# Patient Record
Sex: Female | Born: 1935 | Race: White | Hispanic: No | Smoking: Former smoker
Health system: Southern US, Community
[De-identification: ages and names within clinical notes are randomized; demographics above are authoritative.]

## PROBLEM LIST (undated history)

## (undated) DIAGNOSIS — Z9889 Other specified postprocedural states: Secondary | ICD-10-CM

## (undated) DIAGNOSIS — C439 Malignant melanoma of skin, unspecified: Secondary | ICD-10-CM

## (undated) DIAGNOSIS — I341 Nonrheumatic mitral (valve) prolapse: Secondary | ICD-10-CM

## (undated) DIAGNOSIS — E039 Hypothyroidism, unspecified: Secondary | ICD-10-CM

## (undated) DIAGNOSIS — R55 Syncope and collapse: Secondary | ICD-10-CM

## (undated) DIAGNOSIS — M8580 Other specified disorders of bone density and structure, unspecified site: Secondary | ICD-10-CM

## (undated) DIAGNOSIS — R112 Nausea with vomiting, unspecified: Secondary | ICD-10-CM

## (undated) DIAGNOSIS — M1990 Unspecified osteoarthritis, unspecified site: Secondary | ICD-10-CM

## (undated) DIAGNOSIS — N189 Chronic kidney disease, unspecified: Secondary | ICD-10-CM

## (undated) DIAGNOSIS — K8020 Calculus of gallbladder without cholecystitis without obstruction: Secondary | ICD-10-CM

## (undated) DIAGNOSIS — D649 Anemia, unspecified: Secondary | ICD-10-CM

## (undated) DIAGNOSIS — I10 Essential (primary) hypertension: Secondary | ICD-10-CM

## (undated) DIAGNOSIS — E785 Hyperlipidemia, unspecified: Secondary | ICD-10-CM

## (undated) HISTORY — DX: Syncope and collapse: R55

## (undated) HISTORY — PX: ORIF ELBOW FRACTURE: SHX5031

## (undated) HISTORY — DX: Malignant melanoma of skin, unspecified: C43.9

## (undated) HISTORY — DX: Nonrheumatic mitral (valve) prolapse: I34.1

## (undated) HISTORY — PX: EXCISION MORTON'S NEUROMA: SHX5013

## (undated) HISTORY — DX: Other specified disorders of bone density and structure, unspecified site: M85.80

## (undated) HISTORY — PX: CATARACT EXTRACTION, BILATERAL: SHX1313

## (undated) HISTORY — PX: ULNAR NERVE TRANSPOSITION: SHX2595

## (undated) HISTORY — DX: Hyperlipidemia, unspecified: E78.5

## (undated) HISTORY — PX: BUNIONECTOMY: SHX129

## (undated) HISTORY — DX: Anemia, unspecified: D64.9

## (undated) HISTORY — DX: Hypothyroidism, unspecified: E03.9

---
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
## 1965-09-24 HISTORY — PX: APPENDECTOMY: SHX54

## 1999-12-26 ENCOUNTER — Other Ambulatory Visit: Admission: RE | Admit: 1999-12-26 | Discharge: 1999-12-26 | Payer: Self-pay | Admitting: Oncology

## 1999-12-26 ENCOUNTER — Encounter (HOSPITAL_COMMUNITY): Payer: Self-pay | Admitting: Oncology

## 1999-12-26 ENCOUNTER — Encounter: Admission: RE | Admit: 1999-12-26 | Discharge: 1999-12-26 | Payer: Self-pay | Admitting: Oncology

## 2001-03-03 ENCOUNTER — Encounter (HOSPITAL_COMMUNITY): Admission: RE | Admit: 2001-03-03 | Discharge: 2001-04-02 | Payer: Self-pay | Admitting: Oncology

## 2001-03-03 ENCOUNTER — Encounter: Admission: RE | Admit: 2001-03-03 | Discharge: 2001-03-03 | Payer: Self-pay | Admitting: Oncology

## 2001-03-03 ENCOUNTER — Encounter (HOSPITAL_COMMUNITY): Payer: Self-pay | Admitting: Oncology

## 2001-05-06 ENCOUNTER — Encounter (HOSPITAL_COMMUNITY): Admission: RE | Admit: 2001-05-06 | Discharge: 2001-06-05 | Payer: Self-pay | Admitting: Oncology

## 2001-05-06 ENCOUNTER — Encounter: Admission: RE | Admit: 2001-05-06 | Discharge: 2001-05-06 | Payer: Self-pay | Admitting: Oncology

## 2001-05-30 ENCOUNTER — Ambulatory Visit (HOSPITAL_COMMUNITY): Admission: RE | Admit: 2001-05-30 | Discharge: 2001-05-30 | Payer: Self-pay | Admitting: Internal Medicine

## 2001-06-26 ENCOUNTER — Encounter: Admission: RE | Admit: 2001-06-26 | Discharge: 2001-06-26 | Payer: Self-pay | Admitting: Oncology

## 2001-06-26 ENCOUNTER — Encounter (HOSPITAL_COMMUNITY): Admission: RE | Admit: 2001-06-26 | Discharge: 2001-07-26 | Payer: Self-pay | Admitting: Oncology

## 2002-02-13 ENCOUNTER — Encounter: Payer: Self-pay | Admitting: Otolaryngology

## 2002-02-13 ENCOUNTER — Ambulatory Visit (HOSPITAL_COMMUNITY): Admission: RE | Admit: 2002-02-13 | Discharge: 2002-02-13 | Payer: Self-pay | Admitting: Otolaryngology

## 2002-03-09 ENCOUNTER — Encounter: Admission: RE | Admit: 2002-03-09 | Discharge: 2002-03-09 | Payer: Self-pay | Admitting: Oncology

## 2002-03-09 ENCOUNTER — Encounter (HOSPITAL_COMMUNITY): Payer: Self-pay | Admitting: Oncology

## 2002-03-09 ENCOUNTER — Encounter (HOSPITAL_COMMUNITY): Admission: RE | Admit: 2002-03-09 | Discharge: 2002-04-08 | Payer: Self-pay | Admitting: Oncology

## 2002-09-08 ENCOUNTER — Ambulatory Visit (HOSPITAL_COMMUNITY): Admission: RE | Admit: 2002-09-08 | Discharge: 2002-09-08 | Payer: Self-pay | Admitting: Otolaryngology

## 2002-09-08 ENCOUNTER — Encounter: Payer: Self-pay | Admitting: Otolaryngology

## 2002-09-24 HISTORY — PX: PARATHYROID EXPLORATION: SHX732

## 2003-03-23 ENCOUNTER — Encounter (HOSPITAL_COMMUNITY): Admission: RE | Admit: 2003-03-23 | Discharge: 2003-04-22 | Payer: Self-pay | Admitting: Oncology

## 2003-03-23 ENCOUNTER — Encounter (HOSPITAL_COMMUNITY): Payer: Self-pay | Admitting: Oncology

## 2003-03-23 ENCOUNTER — Encounter: Admission: RE | Admit: 2003-03-23 | Discharge: 2003-03-23 | Payer: Self-pay | Admitting: Oncology

## 2003-04-19 ENCOUNTER — Ambulatory Visit (HOSPITAL_COMMUNITY): Admission: RE | Admit: 2003-04-19 | Discharge: 2003-04-19 | Payer: Self-pay | Admitting: Family Medicine

## 2003-04-19 ENCOUNTER — Encounter: Payer: Self-pay | Admitting: Family Medicine

## 2003-06-14 ENCOUNTER — Ambulatory Visit (HOSPITAL_COMMUNITY): Admission: RE | Admit: 2003-06-14 | Discharge: 2003-06-14 | Payer: Self-pay | Admitting: Family Medicine

## 2003-06-14 ENCOUNTER — Encounter: Payer: Self-pay | Admitting: Family Medicine

## 2003-09-27 ENCOUNTER — Encounter (HOSPITAL_COMMUNITY): Admission: RE | Admit: 2003-09-27 | Discharge: 2003-10-27 | Payer: Self-pay | Admitting: Oncology

## 2003-09-27 ENCOUNTER — Encounter: Admission: RE | Admit: 2003-09-27 | Discharge: 2003-09-27 | Payer: Self-pay | Admitting: Oncology

## 2004-01-20 ENCOUNTER — Ambulatory Visit (HOSPITAL_COMMUNITY): Admission: RE | Admit: 2004-01-20 | Discharge: 2004-01-20 | Payer: Self-pay | Admitting: Podiatry

## 2004-04-03 ENCOUNTER — Other Ambulatory Visit: Admission: RE | Admit: 2004-04-03 | Discharge: 2004-04-03 | Payer: Self-pay | Admitting: Internal Medicine

## 2004-05-10 ENCOUNTER — Ambulatory Visit (HOSPITAL_COMMUNITY): Admission: RE | Admit: 2004-05-10 | Discharge: 2004-05-10 | Payer: Self-pay | Admitting: Family Medicine

## 2004-05-15 ENCOUNTER — Ambulatory Visit (HOSPITAL_COMMUNITY): Admission: RE | Admit: 2004-05-15 | Discharge: 2004-05-15 | Payer: Self-pay | Admitting: Family Medicine

## 2004-12-25 ENCOUNTER — Ambulatory Visit (HOSPITAL_COMMUNITY): Admission: RE | Admit: 2004-12-25 | Discharge: 2004-12-25 | Payer: Self-pay | Admitting: Podiatry

## 2005-05-14 ENCOUNTER — Ambulatory Visit (HOSPITAL_COMMUNITY): Admission: RE | Admit: 2005-05-14 | Discharge: 2005-05-14 | Payer: Self-pay | Admitting: Family Medicine

## 2005-05-16 ENCOUNTER — Ambulatory Visit (HOSPITAL_COMMUNITY): Admission: RE | Admit: 2005-05-16 | Discharge: 2005-05-16 | Payer: Self-pay | Admitting: Family Medicine

## 2005-05-29 ENCOUNTER — Ambulatory Visit (HOSPITAL_COMMUNITY): Admission: RE | Admit: 2005-05-29 | Discharge: 2005-05-29 | Payer: Self-pay | Admitting: Family Medicine

## 2005-06-05 ENCOUNTER — Other Ambulatory Visit: Admission: RE | Admit: 2005-06-05 | Discharge: 2005-06-05 | Payer: Self-pay | Admitting: Obstetrics and Gynecology

## 2005-06-20 ENCOUNTER — Ambulatory Visit (HOSPITAL_COMMUNITY): Admission: RE | Admit: 2005-06-20 | Discharge: 2005-06-20 | Payer: Self-pay | Admitting: Family Medicine

## 2005-06-21 ENCOUNTER — Encounter (HOSPITAL_COMMUNITY): Admission: RE | Admit: 2005-06-21 | Discharge: 2005-06-23 | Payer: Self-pay | Admitting: Orthopaedic Surgery

## 2005-08-30 ENCOUNTER — Ambulatory Visit (HOSPITAL_COMMUNITY): Admission: RE | Admit: 2005-08-30 | Discharge: 2005-08-30 | Payer: Self-pay | Admitting: Family Medicine

## 2005-10-10 ENCOUNTER — Other Ambulatory Visit: Admission: RE | Admit: 2005-10-10 | Discharge: 2005-10-10 | Payer: Self-pay | Admitting: Dermatology

## 2006-06-24 ENCOUNTER — Ambulatory Visit (HOSPITAL_COMMUNITY): Admission: RE | Admit: 2006-06-24 | Discharge: 2006-06-24 | Payer: Self-pay | Admitting: Family Medicine

## 2006-07-24 ENCOUNTER — Ambulatory Visit (HOSPITAL_COMMUNITY): Admission: RE | Admit: 2006-07-24 | Discharge: 2006-07-24 | Payer: Self-pay | Admitting: Family Medicine

## 2006-10-15 ENCOUNTER — Ambulatory Visit (HOSPITAL_COMMUNITY): Admission: RE | Admit: 2006-10-15 | Discharge: 2006-10-15 | Payer: Self-pay | Admitting: Internal Medicine

## 2006-10-15 ENCOUNTER — Ambulatory Visit: Payer: Self-pay | Admitting: Internal Medicine

## 2006-10-30 ENCOUNTER — Ambulatory Visit (HOSPITAL_COMMUNITY): Admission: RE | Admit: 2006-10-30 | Discharge: 2006-10-30 | Payer: Self-pay | Admitting: Urology

## 2006-12-04 ENCOUNTER — Ambulatory Visit (HOSPITAL_COMMUNITY): Admission: RE | Admit: 2006-12-04 | Discharge: 2006-12-04 | Payer: Self-pay | Admitting: Internal Medicine

## 2007-06-10 ENCOUNTER — Ambulatory Visit (HOSPITAL_COMMUNITY): Admission: RE | Admit: 2007-06-10 | Discharge: 2007-06-10 | Payer: Self-pay | Admitting: Family Medicine

## 2007-08-04 ENCOUNTER — Ambulatory Visit (HOSPITAL_COMMUNITY): Admission: RE | Admit: 2007-08-04 | Discharge: 2007-08-04 | Payer: Self-pay | Admitting: Family Medicine

## 2007-09-22 ENCOUNTER — Ambulatory Visit (HOSPITAL_COMMUNITY): Admission: RE | Admit: 2007-09-22 | Discharge: 2007-09-22 | Payer: Self-pay | Admitting: Family Medicine

## 2007-09-25 HISTORY — PX: ANKLE FUSION: SHX881

## 2007-12-22 ENCOUNTER — Ambulatory Visit (HOSPITAL_COMMUNITY): Admission: RE | Admit: 2007-12-22 | Discharge: 2007-12-22 | Payer: Self-pay | Admitting: Ophthalmology

## 2008-03-23 ENCOUNTER — Inpatient Hospital Stay (HOSPITAL_COMMUNITY): Admission: RE | Admit: 2008-03-23 | Discharge: 2008-03-26 | Payer: Self-pay | Admitting: Orthopedic Surgery

## 2008-07-06 ENCOUNTER — Ambulatory Visit (HOSPITAL_BASED_OUTPATIENT_CLINIC_OR_DEPARTMENT_OTHER): Admission: RE | Admit: 2008-07-06 | Discharge: 2008-07-06 | Payer: Self-pay | Admitting: Orthopedic Surgery

## 2008-08-31 ENCOUNTER — Ambulatory Visit (HOSPITAL_COMMUNITY): Admission: RE | Admit: 2008-08-31 | Discharge: 2008-08-31 | Payer: Self-pay | Admitting: Family Medicine

## 2008-09-13 ENCOUNTER — Encounter: Payer: Self-pay | Admitting: Internal Medicine

## 2008-09-24 ENCOUNTER — Encounter: Payer: Self-pay | Admitting: Internal Medicine

## 2008-10-25 ENCOUNTER — Encounter: Payer: Self-pay | Admitting: Internal Medicine

## 2008-11-22 ENCOUNTER — Encounter: Payer: Self-pay | Admitting: Internal Medicine

## 2009-10-11 ENCOUNTER — Ambulatory Visit (HOSPITAL_COMMUNITY): Admission: RE | Admit: 2009-10-11 | Discharge: 2009-10-11 | Payer: Self-pay | Admitting: Family Medicine

## 2009-10-25 ENCOUNTER — Encounter: Admission: RE | Admit: 2009-10-25 | Discharge: 2009-10-25 | Payer: Self-pay | Admitting: Family Medicine

## 2009-10-27 ENCOUNTER — Ambulatory Visit (HOSPITAL_COMMUNITY): Admission: RE | Admit: 2009-10-27 | Discharge: 2009-10-27 | Payer: Self-pay | Admitting: Family Medicine

## 2009-11-14 ENCOUNTER — Ambulatory Visit (HOSPITAL_COMMUNITY): Admission: RE | Admit: 2009-11-14 | Discharge: 2009-11-14 | Payer: Self-pay | Admitting: Family Medicine

## 2009-11-21 ENCOUNTER — Encounter (INDEPENDENT_AMBULATORY_CARE_PROVIDER_SITE_OTHER): Payer: Self-pay | Admitting: *Deleted

## 2009-12-20 ENCOUNTER — Encounter (INDEPENDENT_AMBULATORY_CARE_PROVIDER_SITE_OTHER): Payer: Self-pay | Admitting: *Deleted

## 2009-12-20 ENCOUNTER — Ambulatory Visit: Payer: Self-pay | Admitting: Gastroenterology

## 2009-12-20 DIAGNOSIS — R933 Abnormal findings on diagnostic imaging of other parts of digestive tract: Secondary | ICD-10-CM | POA: Insufficient documentation

## 2010-01-19 ENCOUNTER — Ambulatory Visit: Payer: Self-pay | Admitting: Gastroenterology

## 2010-01-19 ENCOUNTER — Ambulatory Visit (HOSPITAL_COMMUNITY): Admission: RE | Admit: 2010-01-19 | Discharge: 2010-01-19 | Payer: Self-pay | Admitting: Gastroenterology

## 2010-01-20 ENCOUNTER — Encounter: Payer: Self-pay | Admitting: Gastroenterology

## 2010-01-24 ENCOUNTER — Telehealth: Payer: Self-pay | Admitting: Gastroenterology

## 2010-06-28 ENCOUNTER — Emergency Department (HOSPITAL_COMMUNITY)
Admission: EM | Admit: 2010-06-28 | Discharge: 2010-06-28 | Payer: Self-pay | Source: Home / Self Care | Admitting: Emergency Medicine

## 2010-10-02 ENCOUNTER — Ambulatory Visit
Admission: RE | Admit: 2010-10-02 | Discharge: 2010-10-02 | Payer: Self-pay | Source: Home / Self Care | Attending: Internal Medicine | Admitting: Internal Medicine

## 2010-10-14 ENCOUNTER — Encounter: Payer: Self-pay | Admitting: Family Medicine

## 2010-10-15 ENCOUNTER — Encounter: Payer: Self-pay | Admitting: Family Medicine

## 2010-10-24 NOTE — Letter (Signed)
Summary: New Patient letter  [facility]  [address] [place], [place] [address]   Phone: [phone]  Fax: [phone]       11/21/2009 MRN: [id number]  Taylor Giles [address] [place], [place]  [address]  Dear Ms. Riggins,  Welcome to the Gastroenterology Division at [facility]    You are scheduled to see Dr. Christella Hartigan on 12/20/2009 at 9:00AM on the 3rd floor at [facility], [address]  We ask that you try to arrive at our office 15 minutes prior to your appointment time to allow for check-in.  We would like you to complete the enclosed self-administered evaluation form prior to your visit and bring it with you on the day of your appointment.  We will review it with you.  Also, please bring a complete list of all your medications or, if you prefer, bring the medication bottles and we will list them.  Please bring your insurance card so that we may make a copy of it.  If your insurance requires a referral to see a specialist, please bring your referral form from your primary care physician.  Co-payments are due at the time of your visit and may be paid by cash, check or credit card.     Your office visit will consist of a consult with your physician (includes a physical exam), any laboratory testing he/she may order, scheduling of any necessary diagnostic testing (e.g. x-ray, ultrasound, CT-scan), and scheduling of a procedure (e.g. Endoscopy, Colonoscopy) if required.  Please allow enough time on your schedule to allow for any/all of these possibilities.    If you cannot keep your appointment, please call [phone] to cancel or reschedule prior to your appointment date.  This allows [place] the opportunity to schedule an appointment for another patient in need of care.  If you do not cancel or reschedule by 5 p.m. the business day prior to your appointment date, you will be charged a $50.00 late cancellation/no-show fee.    Thank you for choosing [facility] for your medical needs.  We appreciate the opportunity to care for you.  Please visit [place] at our website  to learn more about our practice.                     Sincerely,                                                             The Gastroenterology Division

## 2010-10-24 NOTE — Assessment & Plan Note (Signed)
History of Present Illness Visit Type: Initial Consult Primary GI MD: Rob Bunting MD Primary Provider: Patrica Duel, MD Requesting Provider: Patrica Duel, MD Chief Complaint: Cystic leision @ pancreatic duct History of Present Illness:     very pleasant 77 year old womanwho has sciatic type pains in left leg, hip.  Eventually imaging of that showed cysts on kidneys, pancreas. Followed up by [place] and eventual CT scan.  She has never known about pancreatic issues in past.  Never had pancreatic, never an alcohol drinker.  Never in major trauma. No pancreatic disease in family.  I reviewedA CAT scan images as well as the reports. This clearly shows a 1-2 cm cystic lesion in the head, uncinate pancreas.  the report also documents an enhancing mural nodule within the cyst.  Over the past year she has gained  ... since foot surgery about 2 years ago.  No adominal pains at all.  she brought lab tests with her from last week showing an essentially normal CBC and normal complete metabolic profile except for a creatinine           Current Medications (verified): 1)  Verelan 240 Mg Xr24h-Cap (Verapamil Hcl) .... Once Daily 2)  Toprol Xl 100 Mg Xr24h-Tab (Metoprolol Succinate) .Marland Kitchen.. 1 By Mouth Once Daily 3)  Feosol 200 (65 Fe) Mg Tabs (Ferrous Sulfate Dried) .... 3 Tabs Weekly 4)  Synthroid 125 Mcg Tabs (Levothyroxine Sodium) .Marland Kitchen.. 1 By Mouth Once Daily 5)  Hydrochlorothiazide 25 Mg Tabs (Hydrochlorothiazide) .Marland Kitchen.. 1 By Mouth Once Daily 6)  Klor-Con 10 10 Meq Cr-Tabs (Potassium Chloride) .Marland Kitchen.. 1 By Mouth Once Daily 7)  Boniva 150 Mg Tabs (Ibandronate Sodium) .... Once Monthly 8)  Plaquenil 200 Mg Tabs (Hydroxychloroquine Sulfate) .... Once Daily 9)  Lovastatin 20 Mg Tabs (Lovastatin) .... Once Daily 10)  Prednisone 2.5 Mg Tabs (Prednisone) .... Once Daily 11)  Vitamin D 400 Unit Tabs (Cholecalciferol) .... Once Daily 12)  Vitamin C Cr 500 Mg Cr-Tabs (Ascorbic Acid) .... Once  Daily  Allergies (verified): 1)  ! Macrobid 2)  ! Neurontin  Past History:  Past Medical History: Hypertension Hyperthyroidism Renal Insufficiency Melanoma Anemia Mytral valve prolapse Cataract DJD Gastritis Diverticulosis Adenomatous Colon Polyps Thyroid adenoma Arthritis  Past Surgical History: Appendectomy Cystectomy Carpal Tunnel Release Tonsillectomy Ulnar nerve injury repair Thyroid Surgery Breast-Lumpectomy Bunionectomy    Family History: No colon cancer No pancreatic disease  Social History: widowed 1 daughter retired quit smoking many years ago non drinker  Review of Systems       Pertinent positive and negative review of systems were noted in the above HPI and GI specific review of systems.  All other review of systems was otherwise negative.   Vital Signs:  Patient profile:   77 year old female Height:      68 inches Weight:      127.25 pounds BMI:     19.42 Pulse rate:   76 / minute Pulse rhythm:   regular BP sitting:   128 / 64  (right arm) Cuff size:   regular  Vitals Entered By: June McMurray CMA Duncan Dull) (December 20, 2009 8:43 AM)  Physical Exam  Additional Exam:  Constitutional: generally well appearing Psychiatric: alert and oriented times 3 Eyes: extraocular movements intact Mouth: oropharynx moist, no lesions Neck: supple, no lymphadenopathy Cardiovascular: heart regular rate and rythm Lungs: CTA bilaterally Abdomen: soft, non-tender, non-distended, no obvious ascites, no peritoneal signs, normal bowel sounds Extremities: no lower extremity edema bilaterally Skin: no lesions on visible  extremities    Impression & Recommendations:  Problem # 1:  Pancreatic cyst, incidentally noted I explained to her that there are 3 basic types of pancreatic cyst. Some represent current malignancy, some can be premalignant, some can never be generate in turn to cancer. I doubt very seriously that she currently has cancer in her pancreas. I  think endoscopic ultrasound will be a great next step to further image the lesion, also we'll perform FNA at the same time and send the fluid for cytology as well as biochemical studies. She also get a CA 19-9 today she wants to wait until after April 15 since she is helping care for her daughter's triplets, her daughter is a IT trainer will be very busy until that time..  That seems very reasonable to me.  Other Orders: T-CA 19-9 [phone])  Patient Instructions: 1)  You will be scheduled for an upper EUS in Late April, early May. 2)  You will get lab test(s) done today (Ca 19-9). 3)  A copy of this information will be sent to Drs. Cresenzo, Dr. Karilyn Cota. 4)  The medication list was reviewed and reconciled.  All changed / newly prescribed medications were explained.  A complete medication list was provided to the patient / caregiver.  Appended Document: Orders Update/EUS    Clinical Lists Changes  Orders: Added new Test order of EUS-Upper (EUS-Upper) - Signed

## 2010-10-24 NOTE — Procedures (Signed)
Summary: Endoscopic Ultrasound  Patient: Taylor Taylor Giles Taylor Giles Note: All result statuses are Final unless otherwise noted.  Tests: (1) Endoscopic Ultrasound (EUS)  EUS Endoscopic Ultrasound                             DONE     [facility]     [address] [place], [place]  [address]           ENDOSCOPIC ULTRASOUND PROCEDURE REPORT           PATIENT:  Taylor Taylor Giles, Taylor Giles  MR#:  [id number]     BIRTHDATE:  [birth year]  GENDER:  female           ENDOSCOPIST:  Rachael Fee, MD     REFERRED BY:  Patrica Duel, M.D.           PROCEDURE DATE:  01/19/2010     PROCEDURE:  Upper EUS w/FNA     ASA CLASS:  Class II     INDICATIONS:  incidental pancreatic cyst noted on recent imaging     done for sciatic type pains; CA 19-9 normal, liver tests normal     MEDICATIONS:   Fentanyl 60 mcg IV, Versed 5 mg IV, cipro 400mg  IV           DESCRIPTION OF PROCEDURE:   After the risks, benefits, and     alternatives of the procedure were thoroughly explained, informed     consent was obtained.  The  endoscope was introduced through the     mouth  and advanced to the duodenum.     <<PROCEDUREIMAGES>>           Endoscopic findings (limited endoscopic views with radial and     linear echoendoscopes):     1. Small hiatal hernia     2. Otherwise normal, limited, UGI examination           EUS findings:     1. Round, anechoic (cystic), 11.78mm lesion in uncinate pancreas.     This does not communicate with pancreatic duct.  There was a 6.6cm     "nodule" along one wall of the cyst that appeared to be more     consistent with a collection of sseveral small septea rather than     a solid mass.  The cyst fluid was completely aspirated using a     single pass with a 22 gauge BS EUS FNA needle yielding 1-2cc of     serosanguinous fluid.     2. Pancreatic parenchyma was otherwise normal appearing but a bit     atrophic in body, tail.     3. No peripancreatic adenopathy or celiac adenopathy.     4. CBD was  normal, non-dilated and without filling defects.     5. Main pancreatic duct was normal, non-dilated.     6. 7-72mm shadowing, hyperchoic stone in gallbladder.     7. Limited views of liver, spleen, portal and splenic vessels were     all normal.           Impression:     11.7 cyst in uncinate pancreas with 6.6cm mural nodule that     appeared more consistent with a group of septea rather than a     solid mural nodule.  The cyst does not communicate with main     pancreatic duct.  Cyst fluid completely aspirated and sent to [facility] for CEA, amylase and also to cytology.  Await cyst fluid     analysis for final recommendations.  She will complete 3 days of     cipro to decrease risk of infection.           ______________________________     Rachael Fee, MD           cc: Taylor Taylor Giles Riling, MD           n.     Taylor Taylor Giles Taylor Giles:   Rachael Fee at 01/19/2010 09:07 AM           Taylor Taylor Giles Taylor Giles, [id number]  Note: An exclamation mark (!) indicates a result that was not dispersed into the flowsheet. Document Creation Date: 01/19/2010 9:08 AM _______________________________________________________________________  (1) Order result status: Final Collection or observation date-time: 01/19/2010 08:50 Requested date-time:  Receipt date-time:  Reported date-time:  Referring Physician:   Ordering Physician: Taylor Taylor Giles Taylor Giles [phone]) Specimen Source:  Source: Launa Grill Order Number: [id number] Lab site:   Appended Document: Endoscopic Ultrasound daughters number [phone] (call her first, pt ok with this)

## 2010-10-24 NOTE — Progress Notes (Signed)
Summary: report  Phone Note Call from Patient Call back at Home Phone [phone]   Caller: Patient Call For: Dr. Christella Hartigan Reason for Call: Talk to Nurse Summary of Call: would like to ensure EGD report and path report are sent to Dr. Patrica Duel Initial call taken by: Vallarie Mare,  Jan 24, 2010 2:27 PM  Follow-up for Phone Call        informed pt that the procedure and the path have been sent to Dr Nobie Putnam. Follow-up by: Chales Abrahams CMA Duncan Dull),  Jan 24, 2010 2:31 PM

## 2010-10-24 NOTE — Miscellaneous (Signed)
Summary: rx  Clinical Lists Changes  Medications: Added new medication of CIPROFLOXACIN HCL 500 MG  TABS (CIPROFLOXACIN HCL) Take 1 twice a day for 3 days - Signed Rx of CIPROFLOXACIN HCL 500 MG  TABS (CIPROFLOXACIN HCL) Take 1 twice a day for 3 days;  #6 x 0;  Signed;  Entered by: Rachael Fee MD;  Authorized by: Rachael Fee MD;  Method used: Print then Give to Patient    Prescriptions: CIPROFLOXACIN HCL 500 MG  TABS (CIPROFLOXACIN HCL) Take 1 twice a day for 3 days  #6 x 0   Entered and Authorized by:   Rachael Fee MD   Signed by:   Rachael Fee MD on 01/19/2010   Method used:   Print then Give to Patient   RxID:   [id number]   Appended Document: rx had itchy rash from the IV cipro will give augmentin script  Appended Document: rx    Clinical Lists Changes  Medications: Removed medication of CIPROFLOXACIN HCL 500 MG  TABS (CIPROFLOXACIN HCL) Take 1 twice a day for 3 days Added new medication of AUGMENTIN 875-125 MG  TABS (AMOXICILLIN-POT CLAVULANATE) take one pill twice daily for 3 days - Signed Rx of AUGMENTIN 875-125 MG  TABS (AMOXICILLIN-POT CLAVULANATE) take one pill twice daily for 3 days;  #6 x 0;  Signed;  Entered by: Rachael Fee MD;  Authorized by: Rachael Fee MD;  Method used: Print then Give to Patient Allergies: Added new allergy or adverse reaction of CIPRO    Prescriptions: AUGMENTIN 875-125 MG  TABS (AMOXICILLIN-POT CLAVULANATE) take one pill twice daily for 3 days  #6 x 0   Entered and Authorized by:   Rachael Fee MD   Signed by:   Rachael Fee MD on 01/19/2010   Method used:   Print then Give to Patient   RxID:   [id number]

## 2010-10-24 NOTE — Procedures (Signed)
Summary: Instruction for procedure/[facility] (out pt)  Instruction for procedure/[facility] (out pt)   Imported By: Sherian Rein 12/23/2009 15:04:44  _____________________________________________________________________  External Attachment:    Type:   Image     Comment:   External Document

## 2010-10-24 NOTE — Letter (Signed)
Summary: EGD Instructions  [facility]  [address] [place], [place] [address]   Phone: [phone]  Fax: [phone]       Taylor Giles    12-15-1927    MRN: [id number]       Procedure Day /Date:01/19/10     Arrival Time: 7 am     Procedure Time:8 am     Location of Procedure:                     [facility] ( Outpatient Registration)    PREPARATION FOR ENDOSCOPY   On 01/19/10  THE DAY OF THE PROCEDURE:  1.   No solid foods, milk or milk products are allowed after midnight the night before your procedure.  2.   Do not drink anything colored red or purple.  Avoid juices with pulp.  No orange juice.  3.  You may drink clear liquids until 4 am, which is 4 hours before your procedure.                                                                                                CLEAR LIQUIDS INCLUDE: Water Jello Ice Popsicles Tea (sugar ok, no milk/cream) Powdered fruit flavored drinks Coffee (sugar ok, no milk/cream) Gatorade Juice: apple, white grape, white cranberry  Lemonade Clear bullion, consomm, broth Carbonated beverages (any kind) Strained chicken noodle soup Hard Candy   MEDICATION INSTRUCTIONS  Unless otherwise instructed, you should take regular prescription medications with a small sip of water as early as possible the morning of your procedure.              OTHER INSTRUCTIONS  You will need a responsible adult at least 75 years of age to accompany you and drive you home.   This person must remain in the waiting room during your procedure.  Wear loose fitting clothing that is easily removed.  Leave jewelry and other valuables at home.  However, you may wish to bring a book to read or an iPod/MP3 player to listen to music as you wait for your procedure to start.  Remove all body piercing jewelry and leave at home.  Total time from sign-in until discharge is approximately 2-3 hours.  You should go home directly  after your procedure and rest.  You can resume normal activities the day after your procedure.  The day of your procedure you should not:   Drive   Make legal decisions   Operate machinery   Drink alcohol   Return to work  You will receive specific instructions about eating, activities and medications before you leave.    The above instructions have been reviewed and explained to me by   _______________________    I fully understand and can verbalize these instructions _____________________________ Date _________

## 2010-10-30 ENCOUNTER — Other Ambulatory Visit (HOSPITAL_COMMUNITY): Payer: Self-pay | Admitting: Family Medicine

## 2010-10-30 DIAGNOSIS — R55 Syncope and collapse: Secondary | ICD-10-CM

## 2010-11-01 ENCOUNTER — Ambulatory Visit (HOSPITAL_COMMUNITY)
Admission: RE | Admit: 2010-11-01 | Discharge: 2010-11-01 | Disposition: A | Discharge status: Home / Self Care | Payer: MEDICARE | Source: Ambulatory Visit | Attending: Family Medicine | Admitting: Family Medicine

## 2010-11-01 ENCOUNTER — Other Ambulatory Visit (HOSPITAL_COMMUNITY): Payer: Self-pay

## 2010-11-01 DIAGNOSIS — I10 Essential (primary) hypertension: Secondary | ICD-10-CM | POA: Insufficient documentation

## 2010-11-01 DIAGNOSIS — I6529 Occlusion and stenosis of unspecified carotid artery: Secondary | ICD-10-CM | POA: Insufficient documentation

## 2010-11-01 DIAGNOSIS — R55 Syncope and collapse: Secondary | ICD-10-CM

## 2010-11-01 DIAGNOSIS — F17200 Nicotine dependence, unspecified, uncomplicated: Secondary | ICD-10-CM | POA: Insufficient documentation

## 2010-12-12 LAB — PANC CYST FLD ANLYS-PATHFNDR-TG

## 2010-12-20 ENCOUNTER — Other Ambulatory Visit (HOSPITAL_COMMUNITY): Payer: Self-pay | Admitting: Family Medicine

## 2010-12-20 DIAGNOSIS — R05 Cough: Secondary | ICD-10-CM

## 2010-12-20 DIAGNOSIS — R059 Cough, unspecified: Secondary | ICD-10-CM

## 2010-12-20 DIAGNOSIS — Z139 Encounter for screening, unspecified: Secondary | ICD-10-CM

## 2010-12-23 ENCOUNTER — Ambulatory Visit (HOSPITAL_COMMUNITY): Payer: MEDICARE

## 2010-12-26 ENCOUNTER — Ambulatory Visit (HOSPITAL_COMMUNITY)
Admission: RE | Admit: 2010-12-26 | Discharge: 2010-12-26 | Disposition: A | Discharge status: Home / Self Care | Payer: MEDICARE | Source: Ambulatory Visit | Attending: Family Medicine | Admitting: Family Medicine

## 2010-12-26 ENCOUNTER — Other Ambulatory Visit (HOSPITAL_COMMUNITY): Payer: Self-pay | Admitting: Family Medicine

## 2010-12-26 DIAGNOSIS — M899 Disorder of bone, unspecified: Secondary | ICD-10-CM | POA: Insufficient documentation

## 2010-12-26 DIAGNOSIS — R05 Cough: Secondary | ICD-10-CM

## 2010-12-26 DIAGNOSIS — R059 Cough, unspecified: Secondary | ICD-10-CM

## 2010-12-26 DIAGNOSIS — F17200 Nicotine dependence, unspecified, uncomplicated: Secondary | ICD-10-CM | POA: Insufficient documentation

## 2010-12-26 DIAGNOSIS — Z1231 Encounter for screening mammogram for malignant neoplasm of breast: Secondary | ICD-10-CM | POA: Insufficient documentation

## 2010-12-26 DIAGNOSIS — M949 Disorder of cartilage, unspecified: Secondary | ICD-10-CM | POA: Insufficient documentation

## 2010-12-26 DIAGNOSIS — Z139 Encounter for screening, unspecified: Secondary | ICD-10-CM

## 2010-12-26 DIAGNOSIS — I10 Essential (primary) hypertension: Secondary | ICD-10-CM | POA: Insufficient documentation

## 2011-02-06 NOTE — Op Note (Signed)
[name], Taylor Giles                ACCOUNT NO.:  [id number]   MEDICAL RECORD NO.:  [id number]          PATIENT TYPE:  AMB   LOCATION:  DSC                          FACILITY:  [facility]   PHYSICIAN:  Leonides Grills, M.D.     DATE OF BIRTH:  [birth year]   DATE OF PROCEDURE:  07/06/2008  DATE OF DISCHARGE:                               OPERATIVE REPORT   PREOPERATIVE DIAGNOSIS:  Right open ankle wound consistent with sinus  tract.   POSTOPERATIVE DIAGNOSIS:  Right open ankle wound consistent with sinus  tract.   OPERATION:  I&D to bone, right ankle open wound.   ANESTHESIA:  General.   SURGEON:  Leonides Grills, MD   ASSISTANT:  Richardean Canal, PA   ESTIMATED BLOOD LOSS:  Minimal.   TOURNIQUET:  None.   COMPLICATIONS:  None.   DISPOSITION:  Stable to PR.   INDICATIONS:  This is a 77 year old female who had right ankle fusion  and was complicated by a sinus open wound formation that was draining  serosanguineous drainage.  She consented the above procedure.  All risks  of infection or vessel injury, deep infection, persistent drainage,  possibility of forming an abscess, and possibility of having  osteomyelitis and affecting the ankle fusion were all explained.  Questions were encouraged and answered.   OPERATION:  The patient was brought to the operating room and placed in  supine position.  After adequate general endotracheal tube anesthesia  was administered as well as Ancef gram IV piggyback, right lower  extremity was prepped and draped in sterile manner.  No tourniquet was  used.  We then made a longitudinal incision on either side of the sinus  open area.  Dissection was carried down directly to bone.  There was no  purulence in this area.  There was some loose fibrous material that was  debrided with a rongeur.  The area was copiously irrigated with normal  saline.  The sinus area was ellipsed out.  The wound was closed with 3-0  PDS.  Skin was closed with 4-0 nylon.   Sterile dressing was applied.  Hard sole shoe was applied.  The patient stable to the PR.     Leonides Grills, M.D.  Electronically Signed    PB/MEDQ  D:  07/06/2008  T:  07/07/2008  Job:  [id number]

## 2011-02-06 NOTE — Discharge Summary (Signed)
NAMECARRINA, SCHOENBERGER                ACCOUNT NO.:  [id number]   MEDICAL RECORD NO.:  [id number]          PATIENT TYPE:  INP   LOCATION:  1620                         FACILITY:  [facility]   PHYSICIAN:  Leonides Grills, M.D.     DATE OF BIRTH:  11-28-1927   DATE OF ADMISSION:  03/23/2008  DATE OF DISCHARGE:                               DISCHARGE SUMMARY   ADMISSION DIAGNOSES:  1. Right ankle osteoarthritis.  2. Right tibial spurs.  3. Right Achilles tendon.  4. Right second through fifth hammer toes.  5. Hypertension.  6. Hypothyroidism.  7. Iron deficiency anemia.  8. Hyperlipidemia.  9. Osteopenia.  10.Melena.  11.Mitral valve prolapse.  12.Chronic renal insufficiency due to Voltaren.   DISCHARGE DIAGNOSES:  1. Status post right ankle fusion with local bone graft, right fibular      osteotomy.  2. Status post right excision of tibial spurs.  3. Status post right percutaneous Tendo Achilles lengthening.  4. Right second through fifth toes MTP joint capsulotomy with      collateral release.  5. Right second through fifth toes proximal phalanx head resection.  6. Right second through fourth toes EDB to EDL tendon transfer.  7. Right fifth toe EDL lengthening.  8. Hypertension.  9. Hypothyroidism.  10.Iron deficiency anemia.  11.Hyperlipidemia.  12.Osteopenia.  13.Melanoma.  14.Mitral valve prolapse.  15.Chronic renal insufficiency due to voltaren.   HISTORY OF PRESENT ILLNESS:  Taylor Giles is a 77 year old female with  advanced osteoarthritis of the right ankle.  Symptomatic right second  through fifth hammertoes and a known right peroneal tendon subluxation.   The patient was admitted to undergo right ankle fusion, Achilles tendon  lengthening and hammertoe reconstruction.   SURGICAL PROCEDURE:  The patient was taken to the operating room on March 23, 2008, by Dr. Leonides Grills, assisted by Rexene Edison, PA-C.  The patient  was placed in a spinal with general anesthesia and then  underwent a  right ankle fusion , right local bone graft, right fibular osteotomy.  Stress x-rays right ankle, right excision tibial spurs.  Right  percutaneous Tendo Achilles lengthening.  Right second through fifth  toes MTP joint capsulotomies with collateral release right second  through fifth toes.  Proximal phalanx head resection.  Right second  through fourth toes EDB to EDL tendon transfer.  Right fifth toe EDL  lengthening.   The patient tolerated the procedures well and returned to recovery in  good, stable condition.   CONSULTATIONS:  The following consultations were obtained while the  patient was hospitalized:  Case management, PT/OT.   HOSPITAL COURSE:  Postop the patient had significant pain which was  controlled with narcotics, and she as given Ambien for sleep postop day  #1.  The patient overall doing well, pain under better control.  Some  nausea in the a.m.  Temperature was 97.8, blood pressure 155/66, heart  rate 63, respirations 14.  The patient was 96% on 2 liters.  She had  1750 urine out's through the Foley.  In's were not done.  Patient's  right lower leg splint  was in tact and he was taking clean dressing and  sensation intact in first through fifth toes.  Postop day #2, the  patient's pain is under much better control.  Only complaint is  constipation.  PT recommended stent placement. Patient tolerated diet  well and voiding well.  Foley had been removed.   The patient was afebrile, vital signs stable.  H&H was 11.2 and 32.5.  White count was slightly elevated at 11,000.  Sodium was at 140,  potassium 3.6, chloride 108, bicarbonate 26, BUN 12, creatinine 1.3,  glucose 106.  I's and O's:  2029 in, 3275 out   At this point, the patient was awaiting skilled nursing facility  placement.  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  Patient most likely to be discharged on  Friday, March 26, 2008, if stable, and if condition  remains unchanged.   MEDICATIONS:  1. Metoprolol 50 mg one p.o. daily.  2. __________20 mg one p.o. daily.  3. Synthroid 125 mcg one p.o. daily.  4. Vitamin D3 800 units p.o. q.12 h.  5. Vitamin C 500 mg one p.o. daily.  6. Omega-3 fatty acid 1 g p.o. q. Day.  7. Calcium two tablets p.o. q.h.s.  8. Ferrous sulfate 325 mg p.o. daily.  9. Verapamil 120 mg one p.o. daily.  10.Robaxin 500 mg p.o. IV p.r.n. spasm.  11.Morphine sulfate 2-3 mg IV q.h. p.r.n. severe pain.  12.Psyllium 1 p.o. p.r.n. constipation.  13.Ambien 5 mg p.o. q.h.s. p.r.n.  14.Vicodin 5/325 1-2 tablets p.o. q.4 h p.r.n. pain.  15.Aspirin 325 mg one p.o. b.i.d. while nonweightbearing for DVT      prophylaxis.   Floor medications to be adjusted by skilled facility physician.   LABORATORY DATA:  Labs ordered March 18, 2008, hemoglobin 11.9,  hematocrit 35.5.  B-met March 18, 2008, sodium 142, potassium 4.6,  chloride 105, bicarbonate 28, glucose 130, BUN 28, creatinine 1.69,  calcium 9.3.   DISCHARGE INSTRUCTIONS:  1. Weightbearing status:  The patient is nonweightbearing on the right      lower leg.  2. Special instructions:  Elevated the right leg above heart.  Pin      track care with Q-tip and peroxide daily.  3. Keep dressing clean dry and intact.  4. Diet:  Regular diet, advanced as tolerated.   FOLLOWUP:  The patient is to follow up with Dr. Lestine Box in two weeks  postop from March 23, 2008. Please call office at [phone] for  appointment.   CONDITION ON DISCHARGE:  The patient was in good stable condition at  postop day #2.  If she remains stable, she is to be discharged to  skilled nursing facility once bed is available.      Richardean Canal, P.A.      Leonides Grills, M.D.  Electronically Signed    GC/MEDQ  D:  03/25/2008  T:  03/25/2008  Job:  [id number]   cc:   Leonides Grills, M.D.  Fax: [phone]

## 2011-02-06 NOTE — Op Note (Signed)
NAMEKENEDEE, MOLESKY                ACCOUNT NO.:  [id number]   MEDICAL RECORD NO.:  [id number]          PATIENT TYPE:  INP   LOCATION:  0005                         FACILITY:  [facility]   PHYSICIAN:  Leonides Grills, M.D.     DATE OF BIRTH:  September 06, 1928   DATE OF PROCEDURE:  03/23/2008  DATE OF DISCHARGE:                               OPERATIVE REPORT   PREOPERATIVE DIAGNOSES:  1. Right ankle osteoarthritis.  2. Right tibial spurs.  3. Right tight Achilles tendon.  4. Right second through fifth hammertoes.   POSTOPERATIVE DIAGNOSES:  1. Right ankle osteoarthritis.  2. Right tibial spurs.  3. Right tight Achilles tendon.  4. Right second through fifth hammertoes.   OPERATION:  1. Right ankle fusion.  2. Right local bone graft.  3. Right fibular osteotomy.  4. Stress x-rays, right ankle.  5. Right excision tibial spurs.  6. Right percutaneous tendo Achilles lengthening.  7. Right second through fifth toes MTP joint dorsal capsulotomies with      collateral release.  8. Right second through fifth toes proximal phalanx head resection.  9. Right second through fourth toes EDB to EPL tendon transfer.  10.Right fifth toe EDL Z lengthening.   ANESTHESIA:  Spinal with general.   SURGEON:  Leonides Grills, M.D.   ASSISTANT:  Rexene Edison, P.A.-C.   ESTIMATED BLOOD LOSS:  Minimal.   TOURNIQUET TIME:  Approximately 2 hours.   COMPLICATIONS:  None.   DISPOSITION:  Stable to PR.   INDICATIONS:  This is a 77 year old female who has had longstanding  right ankle pain as well as painful hammer toes that were interfering  with her life to the point she cannot do what she wants to do.  She was  consented to the above procedure.  All risks which include infection,  nerve or vessel injury, nonunion, malunion, hardware rotation, hardware  failure, persistent pain, worsening pain, prolonged recovery, stiffness,  arthritis of juxta-articular joints, recurrence of hammer toe deformity,  development  of a cock-up toe deformity, and that her toes would move  differently were all explained.  Questions were encouraged and answered.   OPERATION:  The patient was brought to the operating room and placed in  supine position.  After adequate spinal anesthesia was administered with  light general as well as Ancef 1 g IV piggyback, a bump was placed in  the ipsilateral hip slightly internally rotating the right lower  extremity.  All bony prominences were well-padded.  Right lower  extremity was then prepped and draped in a sterile manner over a  proximally placed thigh tourniquet.  The limb was gravity exsanguinated.  Tourniquet was elevated to 290 mmHg.  A longitudinal incision over the  lateral aspect of the lateral malleolus fibula.  Dissection was carried  down to bone full-thickness.  Flap was then developed anteriorly.  Syndesmosis was entered and released.  The ankle was entered as well.  Soft tissue was elevated off the anterior aspect of the tibia, and the  joint was entered.  The osteophytes off the distal tibia were carefully  dissected out, and  then with a curved 1/4-inch osteotome and a mallet,  these were removed as well as with the aid of rongeur.  These were  placed on the back table as local bone graft.  We then performed a  fibular osteotomy approximately 6 cm proximal to the tip of the lateral  malleolus, and an approximately 1-cm bone wedge was removed.  Once this  was done, we placed again this on the back table as bone graft.  We then  removed the soft tissue off the lateral aspect of the tibia.  Once this  was done, multiple 2-mm drill holes were placed in this area for  iatrogenic vascular channels.  We preserved the posterior soft tissue  attachment to the fibula for blood supply to this area, externally  rotating the lateral malleolus fibula and entered the ankle joint.  We  then removed what was remaining of the ankle joint cartilage with curved  1/4-inch osteotome,  curette and a synovectomy rongeur.  Once this was  done, we then placed multiple 2-mm drill holes on either side joint as  well as fish scaled over this area as well.  We then performed a  percutaneous tendo Achilles lengthening with 2 medial and 1 lateral  hemisections of the Achilles tendon with an 11 blade scalpel.  This had  excellent release of the tight Achilles tendon, and we were able to  position the ankle in a better position.  We then dorsiflexed,  posteriorly translated, reduced the valgus tilt, and slightly externally  rotated the talus to reduce the pes planovalgus deformity.  We then  provisionally fixed this with a K-wire.  We obtained x-rays in AP and  lateral planes that showed this was in excellent position.  We then made  a small longitudinal incision over the metaphyseal flare medially over  the tibia.  Dissection was carried down to bone.  We then placed a 6.5-  mm, 16-mm threaded cancellus Synthes screw using 4.5, 3.2-mm drill  holes, respectively.  This had excellent purchase and compression across  the fusion site.  We then removed the K-wire and then replaced this with  another 6.5-mm, 16-mm threaded cancellus screw using 4.5, 3.2-mm drill  holes respectively.  Again, this had excellent purchase and compression  across the fusion site and maintenance of reduction.  We then removed  approximately 10-20% of the medial edge of the lateral malleolus using a  sagittal saw.  This cut was made in the sagittal plane.  Once this was  removed, we then applied the 2 surfaces of the fibula to the lateral  aspect of the distal tibia as well as the lateral facet of the talus.  We used the fibula as a biological plate.  We then used a two-point  reduction clamp, applied this to this area, and then placed two 6.5-mm,  16-mm threaded cancellous screws, 1 into the talus, 1 into the tibia  using 4.5, 3.2-mm drill holes respectively.  This had excellent  compression and maintenance  of the reduction.  We then obtained stress x-  rays in AP lateral mortis views.  This showed no gross motion, fixation  proposition, excellent alignment as well.  There was no violation of the  subtalar joint.  The area was copiously irrigated with normal saline.  Stress/strain relieving local bone graft was applied to the anterior  tibia as well as the lateral malleolar area as well.  This was packed  into place.  We then discontinued the tourniquet, and  hemostasis was  obtained.  There was a palpable dorsalis pedis pulse.  We closed the  subcutaneous tissue with 3-0 Vicryl.  Skin was closed with 4-0 nylon  over all wounds.  We then gravity exsanguinated the leg once again.  The  tourniquet was inflated to 290 mmHg.  A longitudinal incision over the  right second toe was then made.  Dissection was carried down through  skin, and hemostasis was obtained.  EDL was tenotomized proximal medial  and the brevis distal lateral and retracted out of harm's way for later  tendon transfer.  We then skeletonized the distal aspect of the proximal  phalanx head.  Head was then removed with a rongeur followed by a bone  cutter.  This cut was made perpendicular to long axis of the proximal  phalanx.  We also performed a dorsal capsulotomy and collateral release  of the MTP joint, and this allowed the toe to reduced beautifully.  Once  we reduced the PIP joint and the MTP joint, the toe was reduced  beautifully, and we did not feel we needed to do FDL to proximal phalanx  tendon transfer.  We did the same exact procedure through a separate  incision for the third, fourth and fifth toes respectively, except for  the fifth toe we performed an EDL Z lengthening, by Z lengthening the  EDL tendon and again retracted out of harm's way for later repair.  We  then went back to the second toe and placed a 0.045 K-wire antegrade  through middle and distal phalanx, reduced PIP joint, and fired this  across the MTP  joint with the toe held in reduced position.  We then did  the exact same thing for the third, fourth and fifth toes respectively.  At that point, we removed the tourniquet and the toes pinked up nicely.  We then performed an EDB to EDL tendon transfer using 3-0 Maxolon  stitch.  This had an outstanding repair and reconstruction.  We did this  for the second, third and fourth toes, and for the fifth toe, we  performed an EDL Z lengthening, again with a 3-0 Maxolon stitch.  Again,  this had an outstanding repair.  K-wires were bent, cut and capped.  Skin relieving incisions were made on either side of the K-wire.  Once  the area was copiously irrigated with normal saline, skin was closed 4-0  nylon stitch.  Sterile dressing was applied.  Modified Jones dressing  was applied with the ankle in neutral dorsiflexion.  The patient was  stable to PR.      Leonides Grills, M.D.  Electronically Signed     PB/MEDQ  D:  03/23/2008  T:  03/23/2008  Job:  [id number]

## 2011-02-09 NOTE — Procedures (Signed)
NAME:  Taylor Giles, Taylor Giles                          ACCOUNT NO.:  [id number]   MEDICAL RECORD NO.:  [id number]                   PATIENT TYPE:  AMB   LOCATION:  DAY                                  FACILITY:  [facility]   PHYSICIAN:  Edward L. Juanetta Gosling, M.D.             DATE OF BIRTH:  1928/06/21   DATE OF PROCEDURE:  DATE OF DISCHARGE:                                EKG INTERPRETATION   TIME:  January 18, 2004, at 1403.   FINDINGS:  1. The rhythm is sinus rhythm with a rate in the 60's.  2. There are multiple PAC's.  3. There is an incomplete right bundle branch block.   IMPRESSION:  Abnormal electrocardiogram.      ___________________________________________                                            Oneal Deputy. Juanetta Gosling, M.D.   ELH/MEDQ  D:  01/19/2004  T:  01/20/2004  Job:  [id number]

## 2011-02-09 NOTE — Op Note (Signed)
Taylor Giles, Taylor Giles                ACCOUNT NO.:  [id number]   MEDICAL RECORD NO.:  [id number]          PATIENT TYPE:  AMB   LOCATION:  DAY                           FACILITY:  [facility]   PHYSICIAN:  [name]. 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   POSTOPERATIVE DIAGNOSIS:  Hallux varus deformity with hammer toe deformity,  right hallux.   PROCEDURE:  Arthroplasty, exostectomy.   INDICATION FOR SURGERY:  A longstanding history of pain unrelieved by  conservative care.   The patient was brought in the operating room and placed on the operating  table in supine position.  The patient's lower right foot and leg were then  prepped and draped in the usual aseptic manner.  Then with an ankle  tourniquet in place and well-padded to prevent contusion and elevated to 250  mmHg after exsanguination of the right foot, the following surgical  procedures were then performed under monitored anesthesia care.   First procedure:  ARTHROPLASTY, EXOSTECTOMY, RIGHT HALLUX.  Attention was  directed to the medial aspect of the right hallux, where a linear incision  made, the incision widened and deepened via sharp and blunt dissection,  making sure to identify and retract all vital structures.  A capsular  incision was then made and the medial aspect of the hallux identified and  resected using a Zimmer oscillating saw.  The wound was lavaged with copious  amounts of sterile saline and the capsule and subcutaneous tissues brought  together with a continuous running suture of 4-0 Dexon.  The skin was  approximated utilizing running subcuticular suture of 4-0 Dexon.   All surgical sites were then infiltrated with approximately 1/8 mL of  dexamethasone phosphate and a mild compressive bandage consisting of  Betadine-soaked Adaptic, sterile 4 x 4's, and sterile Kling was applied.  The patient tolerated the procedure well and left the operating room in good  condition with vital signs stable to the recovery room.      [name] [name], D.P.M.  Electronically Signed     DBT/MEDQ  D:  12/25/2004  T:  12/25/2004  Job:  [id number]

## 2011-02-09 NOTE — Op Note (Signed)
NAMECAM, HARNDEN                ACCOUNT NO.:  [id number]   MEDICAL RECORD NO.:  [id number]          PATIENT TYPE:  AMB   LOCATION:  DAY                           FACILITY:  [facility]   PHYSICIAN:  Lionel December, M.D.    DATE OF BIRTH:  Sep 05, 1928   DATE OF PROCEDURE:  10/15/2006  DATE OF DISCHARGE:                               OPERATIVE REPORT   PROCEDURE:  Colonoscopy.   INDICATION:  Hesper is a 77 year old Caucasian female with history of  colonic polyps whose last examination was in 09/02.  She is doing fine  other than occasional self-limiting diarrhea.  Family history is  negative for colorectal carcinoma.  Procedure was reviewed with the  patient.  Informed consent was obtained.   MEDICATIONS:  Medications for conscious sedation, Demerol 50 mg IV and  Versed 4 mg IV.   FINDINGS:  The procedure was performed in the endoscopy suite.  The  patient's vital signs and O2 saturation were monitored during the  procedure and remained stable.  The patient was placed in left lateral  position and rectal examination performed.  No abnormality noted on  external or digital examination.  Pentax videoscope was placed in the  rectum and advanced under vision into the sigmoid colon.  Large  scattered diverticula were noted at proximal sigmoid colon, which was  very tortuous.  Preparation was excellent.  By repeatedly withdrawing  the scope and using abdominal pressure, I was able to advance the scope  into the ascending colon and over the splenic flexure into the  transverse colon.  Very redundant transverse colon.  The scope was  passed into the proximal transverse colon, but could not get beyond the  hepatic flexure and cecum.  Multiple attempts were made by repeatedly  withdrawing the scope into descending and sigmoid colon, but different  positions and abdominal pressure but  without help.  The scope could  never be maintained in a straight position.  Examination was incomplete.  Segments  of mucosa that were examined on the way out revealed no mucosal  abnormalities.  There were few diverticula proximal to sigmoid colon.  Rectal mucosa similarly was normal.  There was a tiny hyperplastic-  appearing polyp at rectum which was left alone.  The scope was  retroflexed to examine the anorectal junction, which was unremarkable.  The endoscope was then withdrawn.  The patient tolerated the procedure  well.   FINAL DIAGNOSES:  1. Incomplete examination to proximal transverse colon.  2. Redundant colon with scattered diverticula, most of which are in      proximal sigmoid colon.   RECOMMENDATIONS:  We will arrange for a barium enema in a few weeks.      Lionel December, M.D.  Electronically Signed     NR/MEDQ  D:  10/15/2006  T:  10/15/2006  Job:  [id number]   cc:   Patrica Duel, M.D.  Fax: [phone]

## 2011-02-09 NOTE — Op Note (Signed)
NAME:  Taylor Giles, Taylor Giles                          ACCOUNT NO.:  [id number]   MEDICAL RECORD NO.:  [id number]                   PATIENT TYPE:  AMB   LOCATION:  DAY                                  FACILITY:  [facility]   PHYSICIAN:  [name]. [name], D.P.M.             DATE OF BIRTH:  1928/08/09   DATE OF PROCEDURE:  01/20/2004  DATE OF DISCHARGE:                                 OPERATIVE REPORT   PREOPERATIVE DIAGNOSIS:  Hallux hammer toe, right foot secondary to hallux  varus deformity.   POSTOPERATIVE DIAGNOSES:  Hallux hammer toe, right foot secondary to hallux  varus deformity.   PROCEDURE:  Arthroplasty right hallux.   SURGEON:  [name]. [name], D.P.M.   ANESTHESIA:  Local.   INDICATIONS:  Pain from dorsal bump that has developed on the hallux.   DESCRIPTION OF PROCEDURE:  The patient brought to the operating room and  placed on the operating table in the supine position  The patient's lower  right foot and leg was then prepped and draped in the usual aseptic manner.  Then, with an ankle tourniquet placed and well-padded to prevent contusion;  elevated to 250 mmHg and exsanguination of the right foot the following  surgical procedures were then performed under monitored anesthesia care:   ARTHROPLASTY RIGHT HALLUX:  Attention was directed to the dorsal aspect of  the right hallux where a lazy-S incision was made over the previous area of  the interphalangeal joint.  The incision was widened and deepened via sharp  and blunt dissection being sure to identify and retract all vital  structures.  The hyperostosis on the dorsomedial aspect and the dorsolateral  aspect of the toe was then identified and resected utilizing a Zimmer  oscillating saw.  All rough edges were rasped smooth.  The wound was lavaged  with copious amounts of sterile saline; and the capsule and subcutaneous  tissues were approximated with sutures of 4-0 Dexon.  The skin was  approximated utilizing, horizontal  mattress sutures of 4-0 Prolene.   All surgical sites were then infiltrated with approximately 1/8 cc of  dexamethasone phosphate and mild compressive bandages consisting of Betadine  soaked Adaptic, sterile 4 x 4's and sterile Kling was then applied.  The  patient tolerated the procedure well and left the operating room in apparent  good condition.  Vital signs stable to recovery room.      ___________________________________________                                            [name] [name], D.P.M.   DBT/MEDQ  D:  01/20/2004  T:  01/20/2004  Job:  [id number]

## 2011-02-09 NOTE — H&P (Signed)
NAME:  Taylor Giles, Taylor Giles                          ACCOUNT NO.:  [id number]   MEDICAL RECORD NO.:  [id number]                   PATIENT TYPE:  AMB   LOCATION:  DAY                                  FACILITY:  [facility]   PHYSICIAN:  [name]. [name], D.P.M.             DATE OF BIRTH:  1928-01-20   DATE OF ADMISSION:  DATE OF DISCHARGE:                                HISTORY & PHYSICAL   ANTICIPATED DATE OF SURGERY:  January 20, 2004.   HISTORY OF PRESENT ILLNESS:  Ms. Pickart is a 77 year old female that has had  a chronic problems with contracted hallux on the right foot.  The patient  underwent an IP fusion secondary to hallux varus deformity.  The patient  continues to suffer fro residual effects of the hallux varus deformity with  a dorsally contracted right great toe.  The dorsomedial aspect of the right  great toe does hit up against the top of her shoe and she has difficulty  wearing enclosed shoe gear.   PAST MEDICAL HISTORY:  The past medical history reveals the patient was  hospitalized for:  1. Tonsillectomy.  2. D&C.  3. The patient had an arm fracture.  4. The patient had a nerve transplant.  5. An adenoma removed from the back.  6. The patient has had previous bunion surgeries and IP fusions of the     hallux of the right foot.   MEDICATIONS:  Presently she takes vitamins and Synthroid.   ALLERGIES:  The patient has no allergies.   HISTORY:  No history of hepatitis or blood transfusion.   PHYSICAL EXAMINATION:  EXTREMITIES:  Lower extremity exam revealed palpable  pedal pulses.  NEUROLOGIC:  Neurological examination is within normal limits.  MUSCULOSKELETAL:  Musculoskeletal exam reveals dorsally contracted right  great toe with a lesion on the dorsomedial aspect of the toe.   ASSESSMENT:  Hallus varus deformity with dorsal contracture of the hallux.   PLAN:  We will basically go into a surgical exostectomy just to remove the  offending part of bone to try to give her  some relief from wearing enclosed  shoes.   The procedure was reviewed with the patient including complications of the  procedure such as infection, bone infection, and postoperative pain and  swelling, etc.  The patient understands the same and surgery has been  scheduled for January 20, 2004.     ___________________________________________                                         [name]. [name], D.P.M.   DBT/MEDQ  D:  01/19/2004  T:  01/20/2004  Job:  [id number]

## 2011-02-09 NOTE — H&P (Signed)
NAMEKAROLYNN, INFANTINO                ACCOUNT NO.:  [id number]   MEDICAL RECORD NO.:  [id number]          PATIENT TYPE:  AMB   LOCATION:  DAY                           FACILITY:  [facility]   PHYSICIAN:  [name]. [name], D.P.M.DATE OF BIRTH:  19-Nov-1927   DATE OF ADMISSION:  12/25/2004  DATE OF DISCHARGE:  LH                                HISTORY & PHYSICAL   Ms. Badami presented to the office.  She had undergone previous foot surgery  on her right foot where she developed a varus deformity.  Subsequently, she  underwent an IP fusion.  Toe was still in varus, but now she had pain on the  medial aspect of the big toe for which we basically are just going to go in  a do an arthroplasty exostectomy just to relieve the discomfort.   PREVIOUS HOSPITALIZATIONS AND SURGERY:  1.  D&C.  2.  Appendectomy.  3.  Broken arm, elbow.  4.  Thyroid surgery.  5.  Previous bunion surgery.   MEDICATIONS:  Synthroid, Celebrex, Fosamax, Norvasc, hydrochlorothiazide,  Toprol, K-Dur, vitamins, calcium.   ALLERGIES:  None.   FAMILY HISTORY:  Family history of stroke, heart attack.   REVIEW OF SYSTEMS:  History of cancer, anemia, arthritis, hypertension.   PHYSICAL EXAMINATION:  EXTREMITIES:  Lower extremity exam reveals palpable  pedal pulses, DT, and PT with spontaneous capillary filling time.  NEUROLOGIC:  Essentially within normal limits.  MUSCULOSKELETAL:  The patient has a hallux varus deformity of her right foot  with the noted hyperostosis on the medial aspect of the big toe.   PLAN:  We have decided to go ahead and go in and do a surgical exostectomy,  arthroplasty of the first MTP and of the toe.  Reviewed the procedure with  the patient including possible complications of the procedure such as  infection, bone infection, postoperative pain, swelling, etc.  The patient  seems to understand the same.  Again, surgery has been scheduled for December 25, 2004.      DBT/MEDQ  D:  12/24/2004  T:   12/24/2004  Job:  [id number]

## 2011-06-21 LAB — BASIC METABOLIC PANEL
BUN: 28 — ABNORMAL HIGH
CO2: 28
CO2: 26
Calcium: 9.3
Calcium: 8.4
Chloride: 105
Creatinine, Ser: 1.69 — ABNORMAL HIGH
GFR calc Af Amer: 48 — ABNORMAL LOW
GFR calc non Af Amer: 39 — ABNORMAL LOW
Potassium: 3.6

## 2011-06-21 LAB — CBC
HCT: 32.5 — ABNORMAL LOW
Hemoglobin: 11.2 — ABNORMAL LOW
RBC: 3.48 — ABNORMAL LOW

## 2011-06-26 LAB — BASIC METABOLIC PANEL
BUN: 20
Glucose, Bld: 121 — ABNORMAL HIGH
Potassium: 4.5

## 2011-06-26 LAB — POCT HEMOGLOBIN-HEMACUE: Hemoglobin: 11.8 — ABNORMAL LOW

## 2011-11-20 ENCOUNTER — Encounter (INDEPENDENT_AMBULATORY_CARE_PROVIDER_SITE_OTHER): Payer: Self-pay | Admitting: *Deleted

## 2011-12-07 ENCOUNTER — Encounter (HOSPITAL_COMMUNITY): Payer: Self-pay

## 2011-12-07 ENCOUNTER — Encounter (HOSPITAL_COMMUNITY): Payer: Self-pay | Admitting: Pharmacy Technician

## 2011-12-07 ENCOUNTER — Encounter (HOSPITAL_COMMUNITY)
Admission: RE | Admit: 2011-12-07 | Discharge: 2011-12-07 | Disposition: A | Discharge status: Home / Self Care | Payer: 59 | Source: Ambulatory Visit | Attending: Obstetrics and Gynecology | Admitting: Obstetrics and Gynecology

## 2011-12-07 ENCOUNTER — Other Ambulatory Visit: Payer: Self-pay

## 2011-12-07 HISTORY — DX: Calculus of gallbladder without cholecystitis without obstruction: K80.20

## 2011-12-07 HISTORY — DX: Other specified postprocedural states: Z98.890

## 2011-12-07 HISTORY — DX: Essential (primary) hypertension: I10

## 2011-12-07 HISTORY — DX: Unspecified osteoarthritis, unspecified site: M19.90

## 2011-12-07 HISTORY — DX: Nausea with vomiting, unspecified: R11.2

## 2011-12-07 HISTORY — DX: Chronic kidney disease, unspecified: N18.9

## 2011-12-07 LAB — BASIC METABOLIC PANEL
CO2: 26 mEq/L (ref 19–32)
CO2: 27 mEq/L (ref 19–32)
Calcium: 9.5 mg/dL (ref 8.4–10.5)
Chloride: 103 mEq/L (ref 96–112)
GFR calc Af Amer: 46 mL/min — ABNORMAL LOW (ref >90)
Glucose, Bld: 227 mg/dL — ABNORMAL HIGH (ref 70–99)
Potassium: 5.3 mEq/L — ABNORMAL HIGH (ref 3.5–5.1)
Potassium: 4.9 mEq/L (ref 3.5–5.1)
Sodium: 138 mEq/L (ref 135–145)
Sodium: 135 mEq/L (ref 135–145)

## 2011-12-07 LAB — SURGICAL PCR SCREEN
MRSA, PCR: NEGATIVE
Staphylococcus aureus: NEGATIVE

## 2011-12-07 LAB — CBC
Platelets: 185 10*3/uL (ref 150–400)
RBC: 4.1 MIL/uL (ref 3.87–5.11)
RDW: 12.9 % (ref 11.5–15.5)
WBC: 7.7 10*3/uL (ref 4.0–10.5)

## 2011-12-07 NOTE — Patient Instructions (Signed)
20 Taylor Giles  12/07/2011   Your procedure is scheduled on:  Tuesday, 12/11/11  Report to [facility] at 1100 AM.  Call this number if you have problems the morning of surgery: [phone]   Remember:   Do not eat food:After Midnight.  May have clear liquids:until Midnight .  Clear liquids include soda, tea, black coffee, apple or grape juice, broth.  Take these medicines the morning of surgery with A SIP OF WATER:    Do not wear jewelry, make-up or nail polish.  Do not wear lotions, powders, or perfumes. You may wear deodorant.  Do not shave 48 hours prior to surgery.  Do not bring valuables to the hospital.  Contacts, dentures or bridgework may not be worn into surgery.  Leave suitcase in the car. After surgery it may be brought to your room.  For patients admitted to the hospital, checkout time is 11:00 AM the day of discharge.   Patients discharged the day of surgery will not be allowed to drive home.  Name and phone number of your driver: driver  Special Instructions: CHG Shower Use Special Wash: 1/2 bottle night before surgery and 1/2 bottle morning of surgery.   Please read over the following fact sheets that you were given: Pain Booklet, MRSA Information, Surgical Site Infection Prevention, Anesthesia Post-op Instructions and Care and Recovery After Surgery  PATIENT INSTRUCTIONS POST-ANESTHESIA  IMMEDIATELY FOLLOWING SURGERY:  Do not drive or operate machinery for the first twenty four hours after surgery.  Do not make any important decisions for twenty four hours after surgery or while taking narcotic pain medications or sedatives.  If you develop intractable nausea and vomiting or a severe headache please notify your doctor immediately.  FOLLOW-UP:  Please make an appointment with your surgeon as instructed. You do not need to follow up with anesthesia unless specifically instructed to do so.  WOUND CARE INSTRUCTIONS (if applicable):  Keep a dry clean dressing on the  anesthesia/puncture wound site if there is drainage.  Once the wound has quit draining you may leave it open to air.  Generally you should leave the bandage intact for twenty four hours unless there is drainage.  If the epidural site drains for more than 36-48 hours please call the anesthesia department.  QUESTIONS?:  Please feel free to call your physician or the hospital operator if you have any questions, and they will be happy to assist you.

## 2011-12-10 NOTE — H&P (Signed)
Taylor Giles is an 78 y.o. female. She is admitted to day surgery for removal pessary which has become impacted and unable to be removed pessary but has been giving support for approximately 2 years for cystocele once pessary is removed, anterior support will be assessed, and anterior repair will be considered.  Pertinent Gynecological History: Menses: post-menopausal Bleeding: None   Menstrual History:  No LMP recorded. postmenopausal    Past Medical History  Diagnosis Date  . Hypertension   . Chronic kidney disease     due to use of votaren, sees Dr. Kristian Covey  . Arthritis   . Gall stones   . PONV (postoperative nausea and vomiting)     Past Surgical History  Procedure Date  . Cataract extraction, bilateral   . Orif elbow fracture age 77    after fx at age 6, Left  . Ulnar nerve transposition age 62  . Bunionectomy     bilateral  . Excision morton's neuroma     left?  . Parathyroid exploration 2004    removal of 2 adenoma tumors   . Thyroid surgery 1950    removal of tumor, adenoma  . Appendectomy 1967    with removal of ovarian cyst  . Ankle fusion 2009    Right    Family History  Problem Relation Age of Onset  . Anesthesia problems Neg Hx   . Hypotension Neg Hx   . Pseudochol deficiency Neg Hx   . Malignant hyperthermia Neg Hx     Social History:  reports that she has been smoking.  She does not have any smokeless tobacco history on file. She reports that she does not drink alcohol or use illicit drugs. remote history of 5 cigarettes per day  Allergies:  Allergies  Allergen Reactions  . Ciprofloxacin     REACTION: itchy rash  . Gabapentin   . Nsaids     Nephrologist ordered not to take d/t chronic kidney disease  . Lamisil Rash  . Nitrofurantoin Rash    macrobid    No prescriptions prior to admission    ROS positive for right buttock pain in the area just lateral to the vagina possibly related to the pessary impaction  There were no vitals  taken for this visit. weight 155.6 blood pressure 150/70 pulse 70s Physical ExamPhysical Examination: General appearance - alert, well appearing, and in no distress, oriented to person, place, and time and normal appearing weight Mental status - alert, oriented to person, place, and time Heart - normal rate and regular rhythm Abdomen - soft, nontender, nondistended, no masses or organomegaly no hernias noted Pelvic - VULVA: normal appearing vulva with no masses, tenderness or lesions, no lesions on the right side of the vagina where discomfort noted,  VAGINA: normal appearing vagina with normal color and discharge, no lesions, vaginal discharge - white discharge consistent with pessary which is in place and unable to be extracted patient discomfort , CERVIX: normal appearing cervix without discharge or lesions, unable to be assessed at this time, exam limited by pessary which obscures vaginal Hemoccult is negative posterior aspects of the pessary can be palpated transrectally Extremities - peripheral pulses normal, no pedal edema, no clubbing or cyanosis  No results found for this or any previous visit (from the past 24 hour(s)).  No results found. BMET    Component Value Date/Time   NA 135 12/07/2011 1420   K 4.9 12/07/2011 1420   CL 102 12/07/2011 1420   CO2 27 12/07/2011  1420   GLUCOSE 227* 12/07/2011 1420   BUN 27* 12/07/2011 1420   CREATININE 1.27* 12/07/2011 1420   CALCIUM 9.5 12/07/2011 1420   GFRNONAA 38* 12/07/2011 1420   GFRAA 45* 12/07/2011 1420    CBC    Component Value Date/Time   WBC 7.7 12/07/2011 1100   RBC 4.10 12/07/2011 1100   HGB 13.2 12/07/2011 1100   HCT 40.1 12/07/2011 1100   PLT 185 12/07/2011 1100   MCV 97.8 12/07/2011 1100   MCH 32.2 12/07/2011 1100   MCHC 32.9 12/07/2011 1100   RDW 12.9 12/07/2011 1100    assessment impacted pessary with history of cystocele  Assessment/Plan:  removal pessary under anesthesia and evaluation of whether to do anterior repair, based  on clinical findings at the time of pessary removal   Dequane Strahan V 12/10/2011, 8:18 PM

## 2011-12-11 ENCOUNTER — Encounter (HOSPITAL_COMMUNITY): Payer: Self-pay | Admitting: Anesthesiology

## 2011-12-11 ENCOUNTER — Encounter (HOSPITAL_COMMUNITY)
Admission: RE | Disposition: A | Discharge status: Home / Self Care | Payer: Self-pay | Source: Ambulatory Visit | Attending: Obstetrics and Gynecology

## 2011-12-11 ENCOUNTER — Encounter (HOSPITAL_COMMUNITY): Payer: Self-pay | Admitting: *Deleted

## 2011-12-11 ENCOUNTER — Ambulatory Visit (HOSPITAL_COMMUNITY): Payer: 59 | Admitting: Anesthesiology

## 2011-12-11 ENCOUNTER — Ambulatory Visit (HOSPITAL_COMMUNITY)
Admission: RE | Admit: 2011-12-11 | Discharge: 2011-12-12 | Disposition: A | Discharge status: Home / Self Care | Payer: 59 | Source: Ambulatory Visit | Attending: Obstetrics and Gynecology | Admitting: Obstetrics and Gynecology

## 2011-12-11 DIAGNOSIS — T8389XA Other specified complication of genitourinary prosthetic devices, implants and grafts, initial encounter: Secondary | ICD-10-CM | POA: Insufficient documentation

## 2011-12-11 DIAGNOSIS — N8112 Cystocele, lateral: Secondary | ICD-10-CM | POA: Insufficient documentation

## 2011-12-11 DIAGNOSIS — I10 Essential (primary) hypertension: Secondary | ICD-10-CM | POA: Insufficient documentation

## 2011-12-11 DIAGNOSIS — Z79899 Other long term (current) drug therapy: Secondary | ICD-10-CM | POA: Insufficient documentation

## 2011-12-11 DIAGNOSIS — Y849 Medical procedure, unspecified as the cause of abnormal reaction of the patient, or of later complication, without mention of misadventure at the time of the procedure: Secondary | ICD-10-CM | POA: Insufficient documentation

## 2011-12-11 DIAGNOSIS — N816 Rectocele: Secondary | ICD-10-CM

## 2011-12-11 DIAGNOSIS — Z01812 Encounter for preprocedural laboratory examination: Secondary | ICD-10-CM | POA: Insufficient documentation

## 2011-12-11 DIAGNOSIS — IMO0002 Reserved for concepts with insufficient information to code with codable children: Secondary | ICD-10-CM

## 2011-12-11 HISTORY — PX: CYSTOCELE REPAIR: SHX163

## 2011-12-11 HISTORY — PX: EXAMINATION UNDER ANESTHESIA: SHX1540

## 2011-12-11 HISTORY — PX: FOREIGN BODY REMOVAL VAGINAL: SHX5324

## 2011-12-11 LAB — URINALYSIS, ROUTINE W REFLEX MICROSCOPIC
Glucose, UA: NEGATIVE mg/dL
Hgb urine dipstick: NEGATIVE
Specific Gravity, Urine: 1.015 (ref 1.005–1.030)
Urobilinogen, UA: 1 mg/dL (ref 0.0–1.0)

## 2011-12-11 LAB — URINE MICROSCOPIC-ADD ON

## 2011-12-11 SURGERY — EXAM UNDER ANESTHESIA
Anesthesia: Spinal | Site: Vagina | Wound class: Clean Contaminated

## 2011-12-11 MED ORDER — FENTANYL CITRATE 0.05 MG/ML IJ SOLN: 25.0000 ug | INTRAMUSCULAR | Status: DC | PRN

## 2011-12-11 MED ORDER — FENTANYL CITRATE 0.05 MG/ML IJ SOLN
INTRAMUSCULAR | Status: DC | PRN
  Administered 2011-12-11: 25 ug via INTRAVENOUS

## 2011-12-11 MED ORDER — MIDAZOLAM HCL 2 MG/2ML IJ SOLN
INTRAMUSCULAR | Status: AC
  Filled 2011-12-11: qty 2

## 2011-12-11 MED ORDER — BUPIVACAINE IN DEXTROSE 0.75-8.25 % IT SOLN
INTRATHECAL | Status: AC
  Filled 2011-12-11: qty 2

## 2011-12-11 MED ORDER — FENTANYL CITRATE 0.05 MG/ML IJ SOLN
INTRAMUSCULAR | Status: AC
  Filled 2011-12-11: qty 2

## 2011-12-11 MED ORDER — METOPROLOL SUCCINATE ER 50 MG PO TB24
100.0000 mg | ORAL_TABLET | Freq: Every day | ORAL | Status: DC
  Administered 2011-12-12: 100 mg via ORAL
  Filled 2011-12-11: qty 2

## 2011-12-11 MED ORDER — BUPIVACAINE-EPINEPHRINE 0.5% -1:200000 IJ SOLN
INTRAMUSCULAR | Status: DC | PRN
  Administered 2011-12-11: 20 mL

## 2011-12-11 MED ORDER — DOXAZOSIN MESYLATE 2 MG PO TABS
1.0000 mg | ORAL_TABLET | Freq: Every day | ORAL | Status: DC
  Administered 2011-12-11: 1 mg via ORAL
  Filled 2011-12-11: qty 1

## 2011-12-11 MED ORDER — 0.9 % SODIUM CHLORIDE (POUR BTL) OPTIME
TOPICAL | Status: DC | PRN
  Administered 2011-12-11: 1000 mL

## 2011-12-11 MED ORDER — LACTATED RINGERS IV SOLN
INTRAVENOUS | Status: DC
  Administered 2011-12-11: 1000 mL via INTRAVENOUS

## 2011-12-11 MED ORDER — HYDROCODONE-ACETAMINOPHEN 5-325 MG PO TABS
1.0000 | ORAL_TABLET | ORAL | Status: DC | PRN
  Administered 2011-12-11 – 2011-12-12 (×2): 2 via ORAL
  Filled 2011-12-11 (×2): qty 2

## 2011-12-11 MED ORDER — LACTATED RINGERS IV SOLN
INTRAVENOUS | Status: DC
  Administered 2011-12-11: 1000 mL via INTRAVENOUS
  Administered 2011-12-11: 12:00:00 via INTRAVENOUS

## 2011-12-11 MED ORDER — CEFAZOLIN SODIUM 1-5 GM-% IV SOLN: 1.0000 g | INTRAVENOUS | Status: DC

## 2011-12-11 MED ORDER — LEVOTHYROXINE SODIUM 112 MCG PO TABS
112.0000 ug | ORAL_TABLET | Freq: Every day | ORAL | Status: DC
  Administered 2011-12-12: 112 ug via ORAL
  Filled 2011-12-11 (×3): qty 1

## 2011-12-11 MED ORDER — MIDAZOLAM HCL 2 MG/2ML IJ SOLN
INTRAMUSCULAR | Status: AC
  Administered 2011-12-11: 1 mg via INTRAVENOUS
  Filled 2011-12-11: qty 2

## 2011-12-11 MED ORDER — MIDAZOLAM HCL 5 MG/5ML IJ SOLN
INTRAMUSCULAR | Status: DC | PRN
  Administered 2011-12-11: 1 mg via INTRAVENOUS

## 2011-12-11 MED ORDER — VERAPAMIL HCL ER 240 MG PO TBCR
240.0000 mg | EXTENDED_RELEASE_TABLET | Freq: Every day | ORAL | Status: DC
  Administered 2011-12-11: 240 mg via ORAL
  Filled 2011-12-11: qty 1

## 2011-12-11 MED ORDER — BUPIVACAINE-EPINEPHRINE PF 0.5-1:200000 % IJ SOLN
INTRAMUSCULAR | Status: AC
  Filled 2011-12-11: qty 10

## 2011-12-11 MED ORDER — ONDANSETRON HCL 4 MG/2ML IJ SOLN: 4.0000 mg | Freq: Once | INTRAMUSCULAR | Status: DC | PRN

## 2011-12-11 MED ORDER — LIDOCAINE HCL 1 % IJ SOLN
INTRAMUSCULAR | Status: DC | PRN
  Administered 2011-12-11: 25 mg via INTRADERMAL

## 2011-12-11 MED ORDER — ZOLPIDEM TARTRATE 5 MG PO TABS: 5.0000 mg | ORAL_TABLET | Freq: Every evening | ORAL | Status: DC | PRN

## 2011-12-11 MED ORDER — HYDROMORPHONE HCL PF 1 MG/ML IJ SOLN: 0.2000 mg | INTRAMUSCULAR | Status: DC | PRN

## 2011-12-11 MED ORDER — EPHEDRINE SULFATE 50 MG/ML IJ SOLN
INTRAMUSCULAR | Status: AC
  Filled 2011-12-11: qty 1

## 2011-12-11 MED ORDER — PANTOPRAZOLE SODIUM 40 MG PO TBEC
40.0000 mg | DELAYED_RELEASE_TABLET | Freq: Every day | ORAL | Status: DC
  Administered 2011-12-11 – 2011-12-12 (×2): 40 mg via ORAL
  Filled 2011-12-11 (×2): qty 1

## 2011-12-11 MED ORDER — CEFAZOLIN SODIUM 1-5 GM-% IV SOLN
INTRAVENOUS | Status: DC | PRN
  Administered 2011-12-11: 1 g via INTRAVENOUS

## 2011-12-11 MED ORDER — CEFAZOLIN SODIUM 1-5 GM-% IV SOLN
INTRAVENOUS | Status: AC
  Filled 2011-12-11: qty 50

## 2011-12-11 MED ORDER — BUPIVACAINE IN DEXTROSE 0.75-8.25 % IT SOLN
INTRATHECAL | Status: DC | PRN
  Administered 2011-12-11: 10 mg via INTRATHECAL

## 2011-12-11 MED ORDER — MIDAZOLAM HCL 2 MG/2ML IJ SOLN
1.0000 mg | INTRAMUSCULAR | Status: DC | PRN
  Administered 2011-12-11 (×2): 1 mg via INTRAVENOUS

## 2011-12-11 MED ORDER — EPHEDRINE SULFATE 50 MG/ML IJ SOLN
INTRAMUSCULAR | Status: DC | PRN
  Administered 2011-12-11: 10 mg via INTRAVENOUS

## 2011-12-11 MED ORDER — PROPOFOL 10 MG/ML IV EMUL
INTRAVENOUS | Status: DC | PRN
  Administered 2011-12-11: 35 ug/kg/min via INTRAVENOUS

## 2011-12-11 MED ORDER — DOCUSATE SODIUM 100 MG PO CAPS
100.0000 mg | ORAL_CAPSULE | Freq: Every day | ORAL | Status: DC
Start: 2011-12-11 — End: 2011-12-12
  Administered 2011-12-11 – 2011-12-12 (×2): 100 mg via ORAL
  Filled 2011-12-11 (×2): qty 1

## 2011-12-11 SURGICAL SUPPLY — 29 items
BAG HAMPER (MISCELLANEOUS) ×2 IMPLANT
CLOTH BEACON ORANGE TIMEOUT ST (SAFETY) ×2 IMPLANT
COVER SURGICAL LIGHT HANDLE (MISCELLANEOUS) ×4 IMPLANT
DECANTER SPIKE VIAL GLASS SM (MISCELLANEOUS) ×3 IMPLANT
DRAPE PROXIMA HALF (DRAPES) ×2 IMPLANT
ELECT REM PT RETURN 9FT ADLT (ELECTROSURGICAL) ×2
ELECTRODE REM PT RTRN 9FT ADLT (ELECTROSURGICAL) ×1 IMPLANT
GLOVE BIOGEL PI IND STRL 6.5 (GLOVE) IMPLANT
GLOVE BIOGEL PI IND STRL 7.0 (GLOVE) IMPLANT
GLOVE BIOGEL PI IND STRL 8.5 (GLOVE) IMPLANT
GLOVE BIOGEL PI INDICATOR 6.5 (GLOVE) ×1
GLOVE BIOGEL PI INDICATOR 7.0 (GLOVE) ×2
GLOVE BIOGEL PI INDICATOR 8.5 (GLOVE) ×1
GLOVE ECLIPSE 6.5 STRL STRAW (GLOVE) ×1 IMPLANT
GLOVE ECLIPSE 8.0 STRL XLNG CF (GLOVE) ×1 IMPLANT
GLOVE ECLIPSE 9.0 STRL (GLOVE) ×2 IMPLANT
GLOVE EXAM NITRILE MD LF STRL (GLOVE) ×1 IMPLANT
GLOVE INDICATOR STER SZ 9 (GLOVE) ×2 IMPLANT
GOWN STRL REIN 3XL LVL4 (GOWN DISPOSABLE) ×3 IMPLANT
GOWN STRL REIN XL XLG (GOWN DISPOSABLE) ×4 IMPLANT
KIT ROOM TURNOVER APOR (KITS) ×2 IMPLANT
MANIFOLD NEPTUNE II (INSTRUMENTS) ×2 IMPLANT
NS IRRIG 1000ML POUR BTL (IV SOLUTION) ×2 IMPLANT
PACK PERI GYN (CUSTOM PROCEDURE TRAY) ×2 IMPLANT
PAD ARMBOARD 7.5X6 YLW CONV (MISCELLANEOUS) ×2 IMPLANT
SET BASIN LINEN APH (SET/KITS/TRAYS/PACK) ×2 IMPLANT
SYR CONTROL 10ML LL (SYRINGE) ×1 IMPLANT
TOWEL OR 17X26 4PK STRL BLUE (TOWEL DISPOSABLE) ×2 IMPLANT
TRAY FOLEY BAG SILVER LF 14FR (CATHETERS) ×1 IMPLANT

## 2011-12-11 NOTE — Anesthesia Postprocedure Evaluation (Signed)
  Anesthesia Post-op Note  Patient: Taylor Giles  Procedure(s) Performed: Procedure(s) (LRB): EXAM UNDER ANESTHESIA (N/A) REMOVAL FOREIGN BODY VAGINAL (N/A) ANTERIOR REPAIR (CYSTOCELE) (N/A)  Patient Location: PACU  Anesthesia Type: Spinal  Level of Consciousness: awake, alert , oriented and patient cooperative  Airway and Oxygen Therapy: Patient Spontanous Breathing  Post-op Pain: none  Post-op Assessment: Post-op Vital signs reviewed, Patient's Cardiovascular Status Stable, Respiratory Function Stable, Patent Airway, No signs of Nausea or vomiting and Pain level controlled  Post-op Vital Signs: Reviewed and stable  Complications: No apparent anesthesia complications

## 2011-12-11 NOTE — Transfer of Care (Signed)
Immediate Anesthesia Transfer of Care Note  Patient: Taylor Giles  Procedure(s) Performed: Procedure(s) (LRB): EXAM UNDER ANESTHESIA (N/A) REMOVAL FOREIGN BODY VAGINAL (N/A) ANTERIOR REPAIR (CYSTOCELE) (N/A)  Patient Location: PACU  Anesthesia Type: Spinal  Level of Consciousness: awake and patient cooperative  Airway & Oxygen Therapy: Patient Spontanous Breathing and Patient connected to face mask oxygen  Post-op Assessment: Report given to PACU RN, Post -op Vital signs reviewed and stable and Patient moving all extremities  Post vital signs: Reviewed and stable  Complications: No apparent anesthesia complications

## 2011-12-11 NOTE — Anesthesia Procedure Notes (Addendum)
Spinal  Patient location during procedure: OR Start time: 12/11/2011 1:47 PM Staffing CRNA/Resident: Breckan Cafiero J Preanesthetic Checklist Completed: patient identified, site marked, surgical consent, pre-op evaluation, timeout performed, IV checked, risks and benefits discussed and monitors and equipment checked Spinal Block Patient position: sitting Prep: Betadine Patient monitoring: heart rate, cardiac monitor, continuous pulse ox and blood pressure Approach: right paramedian Location: L2-3 Injection technique: single-shot Needle Needle type: Spinocan  Needle gauge: 22 G Assessment Sensory level: T12  [id number]        07/2012

## 2011-12-11 NOTE — Anesthesia Preprocedure Evaluation (Signed)
Anesthesia Evaluation  Patient identified by MRN, date of birth, ID band Patient awake    Reviewed: Allergy & Precautions, H&P , NPO status , Patient's Chart, lab work & pertinent test results, reviewed documented beta blocker date and time   History of Anesthesia Complications (+) PONV  Airway Mallampati: II  Neck ROM: Full    Dental  (+) Teeth Intact   Pulmonary neg pulmonary ROS,  breath sounds clear to auscultation        Cardiovascular hypertension, Pt. on medications Rhythm:Regular Rate:Normal     Neuro/Psych    GI/Hepatic   Endo/Other    Renal/GU      Musculoskeletal   Abdominal   Peds  Hematology   Anesthesia Other Findings   Reproductive/Obstetrics                           Anesthesia Physical Anesthesia Plan  ASA: II  Anesthesia Plan: Spinal   Post-op Pain Management:    Induction:   Airway Management Planned: Nasal Cannula  Additional Equipment:   Intra-op Plan:   Post-operative Plan:   Informed Consent: I have reviewed the patients History and Physical, chart, labs and discussed the procedure including the risks, benefits and alternatives for the proposed anesthesia with the patient or authorized representative who has indicated his/her understanding and acceptance.     Plan Discussed with:   Anesthesia Plan Comments:         Anesthesia Quick Evaluation

## 2011-12-11 NOTE — Brief Op Note (Signed)
12/11/2011  2:57 PM  PATIENT:  Taylor Giles  78 y.o. female  PRE-OPERATIVE DIAGNOSIS:  impacted pessary  POST-OPERATIVE DIAGNOSIS:  impacted pessary, cystocele  PROCEDURE:  Procedure(s) (LRB): EXAM UNDER ANESTHESIA (N/A) REMOVAL FOREIGN BODY VAGINAL (N/A) ANTERIOR REPAIR (CYSTOCELE) (N/A)  SURGEON:  Surgeon(s) and Role:    * Tilda Burrow, MD - Primary  PHYSICIAN ASSISTANT:   ASSISTANTS: none   ANESTHESIA:   local and spinal  EBL:  Total I/O In: 600 [I.V.:600] Out: -   BLOOD ADMINISTERED:none  DRAINS: Urinary Catheter (Foley)   LOCAL MEDICATIONS USED:  MARCAINE     SPECIMEN:  Source of Specimen:  vaginal mucosa  DISPOSITION OF SPECIMEN:  PATHOLOGY  COUNTS:  YES  TOURNIQUET:  * No tourniquets in log *  DICTATION: .Dragon Dictation  PLAN OF CARE: Admit for overnight observation  PATIENT DISPOSITION:  PACU - hemodynamically stable.   Delay start of Pharmacological VTE agent (>24hrs) due to surgical blood loss or risk of bleeding: yes

## 2011-12-11 NOTE — Interval H&P Note (Signed)
History and Physical Interval Note:  12/11/2011 1:21 PM  [name]  has presented today for surgery, with the diagnosis of impacted pessary  The various methods of treatment have been discussed with the patient and family. After consideration of risks, benefits and other options for treatment, the patient has consented to  Procedure(s) (LRB): EXAM UNDER ANESTHESIA (N/A) REMOVAL FOREIGN BODY VAGINAL (N/A) as a surgical intervention .  The patients' history has been reviewed, patient examined, no change in status, stable for surgery.  I have reviewed the patients' chart and labs.  Questions were answered to the patient's satisfaction.  CBG today is 96. Outpatient case is expected.   Taylor Giles

## 2011-12-11 NOTE — Op Note (Signed)
The patient was taken to the operating room prepped and draped for vaginal procedure with spinal anesthesia performed. The pessary was easily identifiable and could not be extracted without cutting apart. The pessary upon removal had all portions of the accounted for. Vaginal support was inspected and she had a small cystocele that was felt to be likely to become symptomatic so a small anterior repair was performed cervix was grasped, Marcaine used to inject beneath the cystocele and approximately 2 cm wide x4 cm long lips of vaginal anterior mucosa trimmed off of the underlying bladder tissues and then the edges pulled together with interrupted 0 Vicryl to reduce the cystocele size to prevent future bulging. Posteriorly the posterior perineal body was reinforced by injecting Marcaine sufficiently to elevate the underlying vaginal epithelium off of the perineal body, and horizontal mattress sutures x3 with 0 Vicryl were placed reinforcing and tightening the perineal body to give improve perineal support posteriorly as well. No mucosa was encountered during any of the dissection and the bladder was well out of the surgical field as well. Foley catheter was inserted revealing clear urine the patient allowed to go recovery room in stable condition sponge and needle counts correct

## 2011-12-12 MED ORDER — DSS 100 MG PO CAPS: 100.0000 mg | ORAL_CAPSULE | Freq: Two times a day (BID) | ORAL | Status: AC

## 2011-12-12 MED ORDER — METRONIDAZOLE 0.75 % VA GEL: 1.0000 | Freq: Every day | VAGINAL | Status: AC

## 2011-12-12 NOTE — Progress Notes (Signed)
UR Chart Review Completed-- OIB 

## 2011-12-12 NOTE — Discharge Planning (Signed)
Subjective: 1 Day Post-Op Procedure(s) (LRB): EXAM UNDER ANESTHESIA (N/A) REMOVAL FOREIGN BODY VAGINAL (N/A) ANTERIOR REPAIR (CYSTOCELE) (N/A)  Subjective: Patient reports no problems voiding.    Objective: I have reviewed patient's vital signs, intake and output and medications.  GI: soft, non-tender; bowel sounds normal; no masses,  no organomegaly and incision: clean  Assessment: s/p Procedure(s) (LRB): EXAM UNDER ANESTHESIA (N/A) REMOVAL FOREIGN BODY VAGINAL (N/A) ANTERIOR REPAIR (CYSTOCELE) (N/A): stable  Plan: Advance diet Advance to PO medication Discontinue IV fluids Discharge home Had MetroGel per vagina each bedtime to current medications follow 2 weeks to GYN clinic  LOS: 1 day    Taylor Giles V 12/12/2011, 1:43 PM

## 2011-12-12 NOTE — Progress Notes (Signed)
PIV removed without complaint, patient discharged home. Patient verbalizes understanding of discharge instructions, prescriptions and follow up care. Patient escorted out by staff, transported by family. 

## 2011-12-12 NOTE — Addendum Note (Signed)
Addendum  created 12/12/11 1120 by Franco Nones, CRNA   Modules edited:Notes Section

## 2011-12-12 NOTE — Anesthesia Postprocedure Evaluation (Signed)
Anesthesia Post Note  Patient: Taylor Giles  Procedure(s) Performed: Procedure(s) (LRB): EXAM UNDER ANESTHESIA (N/A) REMOVAL FOREIGN BODY VAGINAL (N/A) ANTERIOR REPAIR (CYSTOCELE) (N/A)  Anesthesia type: Spinal  Patient location: 323  Post pain: Pain level controlled  Post assessment: Post-op Vital signs reviewed, Patient's Cardiovascular Status Stable, Respiratory Function Stable, Patent Airway, No signs of Nausea or vomiting and Pain level controlled  Last Vitals:  Filed Vitals:   12/12/11 0645  BP: 148/67  Pulse: 53  Temp: 36.8 C  Resp: 20    Post vital signs: Reviewed and stable  Level of consciousness: awake and alert   Complications: No apparent anesthesia complications

## 2011-12-13 ENCOUNTER — Encounter (HOSPITAL_COMMUNITY): Payer: Self-pay | Admitting: Obstetrics and Gynecology

## 2011-12-20 ENCOUNTER — Other Ambulatory Visit (HOSPITAL_COMMUNITY): Payer: Self-pay | Admitting: Family Medicine

## 2011-12-20 ENCOUNTER — Ambulatory Visit (HOSPITAL_COMMUNITY)
Admission: RE | Admit: 2011-12-20 | Discharge: 2011-12-20 | Disposition: A | Discharge status: Home / Self Care | Payer: MEDICARE | Source: Ambulatory Visit | Attending: Family Medicine | Admitting: Family Medicine

## 2011-12-20 DIAGNOSIS — F17200 Nicotine dependence, unspecified, uncomplicated: Secondary | ICD-10-CM | POA: Insufficient documentation

## 2011-12-20 DIAGNOSIS — Z139 Encounter for screening, unspecified: Secondary | ICD-10-CM

## 2011-12-20 DIAGNOSIS — R0989 Other specified symptoms and signs involving the circulatory and respiratory systems: Secondary | ICD-10-CM

## 2011-12-20 DIAGNOSIS — R05 Cough: Secondary | ICD-10-CM

## 2011-12-20 DIAGNOSIS — R059 Cough, unspecified: Secondary | ICD-10-CM | POA: Insufficient documentation

## 2011-12-20 DIAGNOSIS — I517 Cardiomegaly: Secondary | ICD-10-CM | POA: Insufficient documentation

## 2011-12-26 ENCOUNTER — Other Ambulatory Visit (HOSPITAL_COMMUNITY): Payer: 59

## 2012-01-07 ENCOUNTER — Ambulatory Visit (HOSPITAL_COMMUNITY)
Admission: RE | Admit: 2012-01-07 | Discharge: 2012-01-07 | Disposition: A | Discharge status: Home / Self Care | Payer: Medicare Other | Source: Ambulatory Visit | Attending: Family Medicine | Admitting: Family Medicine

## 2012-01-07 DIAGNOSIS — R0989 Other specified symptoms and signs involving the circulatory and respiratory systems: Secondary | ICD-10-CM | POA: Insufficient documentation

## 2012-01-07 DIAGNOSIS — R55 Syncope and collapse: Secondary | ICD-10-CM | POA: Insufficient documentation

## 2012-01-07 DIAGNOSIS — Z1231 Encounter for screening mammogram for malignant neoplasm of breast: Secondary | ICD-10-CM | POA: Insufficient documentation

## 2012-01-07 DIAGNOSIS — Z139 Encounter for screening, unspecified: Secondary | ICD-10-CM

## 2012-01-09 ENCOUNTER — Ambulatory Visit (HOSPITAL_COMMUNITY)
Admission: RE | Admit: 2012-01-09 | Discharge: 2012-01-09 | Disposition: A | Discharge status: Home / Self Care | Payer: Medicare Other | Source: Ambulatory Visit | Attending: Nephrology | Admitting: Nephrology

## 2012-01-09 ENCOUNTER — Other Ambulatory Visit: Payer: Self-pay | Admitting: Family Medicine

## 2012-01-09 ENCOUNTER — Other Ambulatory Visit (HOSPITAL_COMMUNITY): Payer: Self-pay | Admitting: Nephrology

## 2012-01-09 DIAGNOSIS — M7989 Other specified soft tissue disorders: Secondary | ICD-10-CM

## 2012-01-09 DIAGNOSIS — M7120 Synovial cyst of popliteal space [Baker], unspecified knee: Secondary | ICD-10-CM | POA: Insufficient documentation

## 2012-01-09 DIAGNOSIS — R928 Other abnormal and inconclusive findings on diagnostic imaging of breast: Secondary | ICD-10-CM

## 2012-01-23 ENCOUNTER — Ambulatory Visit (HOSPITAL_COMMUNITY)
Admission: RE | Admit: 2012-01-23 | Discharge: 2012-01-23 | Disposition: A | Discharge status: Home / Self Care | Payer: 59 | Source: Ambulatory Visit | Attending: Family Medicine | Admitting: Family Medicine

## 2012-01-23 DIAGNOSIS — R928 Other abnormal and inconclusive findings on diagnostic imaging of breast: Secondary | ICD-10-CM

## 2013-01-28 ENCOUNTER — Encounter: Payer: Self-pay | Admitting: Internal Medicine

## 2013-02-18 ENCOUNTER — Telehealth: Payer: Self-pay | Admitting: Internal Medicine

## 2013-02-26 ENCOUNTER — Ambulatory Visit (INDEPENDENT_AMBULATORY_CARE_PROVIDER_SITE_OTHER): Payer: 59 | Admitting: Internal Medicine

## 2013-02-26 ENCOUNTER — Encounter: Payer: Self-pay | Admitting: Internal Medicine

## 2013-02-26 ENCOUNTER — Ambulatory Visit: Payer: 59 | Admitting: Internal Medicine

## 2013-02-26 VITALS — BP 150/80 | HR 48 | Ht 68.0 in | Wt 131.3 lb

## 2013-02-26 DIAGNOSIS — I10 Essential (primary) hypertension: Secondary | ICD-10-CM

## 2013-02-26 DIAGNOSIS — E785 Hyperlipidemia, unspecified: Secondary | ICD-10-CM

## 2013-02-26 DIAGNOSIS — R55 Syncope and collapse: Secondary | ICD-10-CM | POA: Insufficient documentation

## 2013-02-26 NOTE — Patient Instructions (Signed)
Your physician recommends that you schedule a follow-up appointment in: 6 months  

## 2013-02-26 NOTE — Progress Notes (Signed)
OFFICE NOTE  Chief Complaint:  Routine followup, left ankle swelling  Primary Care Physician: Taylor Ribas, MD  HPI:  Taylor Giles is a pleasant 79 year old female with a syncopal episode of unknown etiology. She also has also chronic kidney disease and has hypertension which has been uncontrolled. At her last visit, I recommended increasing her doxazosin up to 1 mg twice daily and had her come back for a repeat blood pressure check which was still elevated. Since then I understand she has seen you and started on HCTZ. She has also had lower extremity edema for which she has been wearing compression stockings. Her renal function actually worsened on HCTZ, but now is improved. Her blood pressure however is still not adequately controlled.   she complains of some mild low lower extremity swelling, but is not wearing her compression stockings as much as she should.  PMHx:  Past Medical History  Diagnosis Date  . Hypertension   . Chronic kidney disease     due to use of votaren, sees Dr. Kristian Giles  . Arthritis   . Gall stones   . PONV (postoperative nausea and vomiting)   . Hyperlipidemia   . Syncope   . Hypothyroidism   . Anemia   . Osteopenia   . Melanoma     left elbow  s/p excision 1982  . Mitral valve prolapse     Past Surgical History  Procedure Laterality Date  . Cataract extraction, bilateral    . Orif elbow fracture  age 45    after fx at age 82, Left  . Ulnar nerve transposition  age 35  . Bunionectomy      bilateral  . Excision morton's neuroma      left?  . Parathyroid exploration  2004    removal of 2 adenoma tumors   . Thyroid surgery  1950    removal of tumor, adenoma  . Appendectomy  1967    with removal of ovarian cyst  . Ankle fusion  2009    Right  . Examination under anesthesia  12/11/2011    Procedure: EXAM UNDER ANESTHESIA;  Surgeon: Taylor Burrow, MD;  Location: [facility];  Service: Gynecology;  Laterality: N/A;  . Foreign body removal  vaginal  12/11/2011    Procedure: REMOVAL FOREIGN BODY VAGINAL;  Surgeon: Taylor Burrow, MD;  Location: [facility];  Service: Gynecology;  Laterality: N/A;  impacted pessary  . Cystocele repair  12/11/2011    Procedure: ANTERIOR REPAIR (CYSTOCELE);  Surgeon: Taylor Burrow, MD;  Location: [facility];  Service: Gynecology;  Laterality: N/A;    FAMHx:  Family History  Problem Relation Age of Onset  . Anesthesia problems Neg Hx   . Hypotension Neg Hx   . Pseudochol deficiency Neg Hx   . Malignant hyperthermia Neg Hx   . Hypertension Father   . Heart attack Father   . Stroke Father   . Hypertension Brother   . Kidney disease Brother   . Heart attack Daughter     SOCHx:   reports that she has been smoking.  She does not have any smokeless tobacco history on file. She reports that she does not drink alcohol or use illicit drugs.  ALLERGIES:  Allergies  Allergen Reactions  . Ciprofloxacin     REACTION: itchy rash  . Gabapentin   . Nsaids     Nephrologist ordered not to take d/t chronic kidney disease  . Nitrofurantoin Rash    macrobid  .  Terbinafine Hcl Rash    ROS: A comprehensive review of systems was negative except for: Respiratory: positive for cough  HOME MEDS: Current Outpatient Prescriptions  Medication Sig Dispense Refill  . acetaminophen (TYLENOL) 325 MG tablet Take 650 mg by mouth 2 (two) times daily as needed. Aches and pain      . Calcium Acetate 667 MG TABS Take 667 mg by mouth 3 (three) times daily.      . cholecalciferol (VITAMIN D) 400 UNITS TABS Take 1,000 Units by mouth daily.       Marland Kitchen doxazosin (CARDURA) 1 MG tablet Take 1 mg by mouth 2 (two) times daily.      . hydrochlorothiazide (HYDRODIURIL) 25 MG tablet Take 25 mg by mouth daily.      Marland Kitchen levothyroxine (SYNTHROID, LEVOTHROID) 112 MCG tablet Take 112 mcg by mouth daily.      Marland Kitchen lovastatin (MEVACOR) 20 MG tablet Take 20 mg by mouth at bedtime.      . Magnesium 250 MG TABS Take 250 mg by mouth at bedtime.      .  metoprolol succinate (TOPROL-XL) 100 MG 24 hr tablet Take 100 mg by mouth daily. Take with or immediately following a meal.      . verapamil (CALAN-SR) 240 MG CR tablet Take 240 mg by mouth at bedtime.      . vitamin B-12 (CYANOCOBALAMIN) 100 MCG tablet Take 100 mcg by mouth daily.      . vitamin C (ASCORBIC ACID) 500 MG tablet Take 500 mg by mouth daily.       No current facility-administered medications for this visit.    LABS/IMAGING: No results found for this or any previous visit (from the past 48 hour(s)). No results found.  VITALS: BP 150/80  Pulse 48  Ht 5\' 8"  (1.727 m)  Wt 131 lb 4.8 oz (59.557 kg)  BMI 19.97 kg/m2  EXAM: General appearance: alert and no distress Neck: no adenopathy, no carotid bruit, no JVD, supple, symmetrical, trachea midline and thyroid not enlarged, symmetric, no tenderness/mass/nodules Lungs: clear to auscultation bilaterally Heart: regular rate and rhythm, S1, S2 normal, no murmur, click, rub or gallop Abdomen: soft, non-tender; bowel sounds normal; no masses,  no organomegaly Extremities: edema Trace left lower Pulses: 2+ and symmetric Skin: Skin color, texture, turgor normal. No rashes or lesions Neurologic: Grossly normal  EKG: Sinus bradycardia with first degree AV block at 48  ASSESSMENT: 1. Hypertension-not at goal 2. Bradycardia with first degree AV block 3. Dyslipidemia 4. Lower extremity edema  PLAN: 1.    [name] seems to be doing fairly well. Her creatinine has improved to 1.42 from 1.63. Unfortunately her blood pressure is still not at goal she may need further adjustment in her medications. She has had no further syncopal episodes.  Plan to see her back in 6 months to a year.  Taylor Nose, MD, [facility] [place] Attending Cardiologist [facility]  Taylor Giles C 02/26/2013, 6:29 PM

## 2013-04-10 ENCOUNTER — Other Ambulatory Visit (HOSPITAL_COMMUNITY): Payer: Self-pay | Admitting: Family Medicine

## 2013-04-10 DIAGNOSIS — E039 Hypothyroidism, unspecified: Secondary | ICD-10-CM

## 2013-04-10 DIAGNOSIS — Z0000 Encounter for general adult medical examination without abnormal findings: Secondary | ICD-10-CM

## 2013-04-10 DIAGNOSIS — M159 Polyosteoarthritis, unspecified: Secondary | ICD-10-CM

## 2013-04-13 ENCOUNTER — Ambulatory Visit (HOSPITAL_COMMUNITY)
Admission: RE | Admit: 2013-04-13 | Discharge: 2013-04-13 | Disposition: A | Discharge status: Home / Self Care | Payer: Medicare Other | Source: Ambulatory Visit | Attending: Family Medicine | Admitting: Family Medicine

## 2013-04-13 DIAGNOSIS — I517 Cardiomegaly: Secondary | ICD-10-CM | POA: Insufficient documentation

## 2013-04-13 DIAGNOSIS — M159 Polyosteoarthritis, unspecified: Secondary | ICD-10-CM | POA: Insufficient documentation

## 2013-04-13 DIAGNOSIS — E039 Hypothyroidism, unspecified: Secondary | ICD-10-CM

## 2013-04-13 DIAGNOSIS — Z0000 Encounter for general adult medical examination without abnormal findings: Secondary | ICD-10-CM

## 2013-04-13 DIAGNOSIS — R42 Dizziness and giddiness: Secondary | ICD-10-CM | POA: Insufficient documentation

## 2013-04-13 DIAGNOSIS — Z1231 Encounter for screening mammogram for malignant neoplasm of breast: Secondary | ICD-10-CM | POA: Insufficient documentation

## 2013-04-26 ENCOUNTER — Emergency Department (HOSPITAL_COMMUNITY): Payer: Medicare Other

## 2013-04-26 ENCOUNTER — Emergency Department (HOSPITAL_COMMUNITY)
Admission: EM | Admit: 2013-04-26 | Discharge: 2013-04-26 | Disposition: A | Discharge status: Home / Self Care | Payer: Medicare Other | Attending: Emergency Medicine | Admitting: Emergency Medicine

## 2013-04-26 ENCOUNTER — Encounter (HOSPITAL_COMMUNITY): Payer: Self-pay | Admitting: *Deleted

## 2013-04-26 DIAGNOSIS — Z8739 Personal history of other diseases of the musculoskeletal system and connective tissue: Secondary | ICD-10-CM | POA: Insufficient documentation

## 2013-04-26 DIAGNOSIS — Z85828 Personal history of other malignant neoplasm of skin: Secondary | ICD-10-CM | POA: Insufficient documentation

## 2013-04-26 DIAGNOSIS — F17200 Nicotine dependence, unspecified, uncomplicated: Secondary | ICD-10-CM | POA: Insufficient documentation

## 2013-04-26 DIAGNOSIS — N189 Chronic kidney disease, unspecified: Secondary | ICD-10-CM | POA: Insufficient documentation

## 2013-04-26 DIAGNOSIS — Y939 Activity, unspecified: Secondary | ICD-10-CM | POA: Insufficient documentation

## 2013-04-26 DIAGNOSIS — E039 Hypothyroidism, unspecified: Secondary | ICD-10-CM | POA: Insufficient documentation

## 2013-04-26 DIAGNOSIS — S6990XA Unspecified injury of unspecified wrist, hand and finger(s), initial encounter: Secondary | ICD-10-CM | POA: Insufficient documentation

## 2013-04-26 DIAGNOSIS — M25439 Effusion, unspecified wrist: Secondary | ICD-10-CM | POA: Insufficient documentation

## 2013-04-26 DIAGNOSIS — E785 Hyperlipidemia, unspecified: Secondary | ICD-10-CM | POA: Insufficient documentation

## 2013-04-26 DIAGNOSIS — M25422 Effusion, left elbow: Secondary | ICD-10-CM

## 2013-04-26 DIAGNOSIS — Z8679 Personal history of other diseases of the circulatory system: Secondary | ICD-10-CM | POA: Insufficient documentation

## 2013-04-26 DIAGNOSIS — S59909A Unspecified injury of unspecified elbow, initial encounter: Secondary | ICD-10-CM | POA: Insufficient documentation

## 2013-04-26 DIAGNOSIS — W1809XA Striking against other object with subsequent fall, initial encounter: Secondary | ICD-10-CM | POA: Insufficient documentation

## 2013-04-26 DIAGNOSIS — Z8719 Personal history of other diseases of the digestive system: Secondary | ICD-10-CM | POA: Insufficient documentation

## 2013-04-26 DIAGNOSIS — I129 Hypertensive chronic kidney disease with stage 1 through stage 4 chronic kidney disease, or unspecified chronic kidney disease: Secondary | ICD-10-CM | POA: Insufficient documentation

## 2013-04-26 DIAGNOSIS — Z9889 Other specified postprocedural states: Secondary | ICD-10-CM | POA: Insufficient documentation

## 2013-04-26 DIAGNOSIS — M899 Disorder of bone, unspecified: Secondary | ICD-10-CM | POA: Insufficient documentation

## 2013-04-26 DIAGNOSIS — Z862 Personal history of diseases of the blood and blood-forming organs and certain disorders involving the immune mechanism: Secondary | ICD-10-CM | POA: Insufficient documentation

## 2013-04-26 DIAGNOSIS — X500XXA Overexertion from strenuous movement or load, initial encounter: Secondary | ICD-10-CM | POA: Insufficient documentation

## 2013-04-26 DIAGNOSIS — Y929 Unspecified place or not applicable: Secondary | ICD-10-CM | POA: Insufficient documentation

## 2013-04-26 DIAGNOSIS — Z79899 Other long term (current) drug therapy: Secondary | ICD-10-CM | POA: Insufficient documentation

## 2013-04-26 LAB — BASIC METABOLIC PANEL
BUN: 31 mg/dL — ABNORMAL HIGH (ref 6–23)
CO2: 29 mEq/L (ref 19–32)
Chloride: 100 mEq/L (ref 96–112)
Creatinine, Ser: 1.48 mg/dL — ABNORMAL HIGH (ref 0.50–1.10)

## 2013-04-26 LAB — CBC WITH DIFFERENTIAL/PLATELET
HCT: 37.8 % (ref 36.0–46.0)
Hemoglobin: 12.7 g/dL (ref 12.0–15.0)
Lymphocytes Relative: 20 % (ref 12–46)
Lymphs Abs: 1.9 10*3/uL (ref 0.7–4.0)
Monocytes Absolute: 1.3 10*3/uL — ABNORMAL HIGH (ref 0.1–1.0)
Monocytes Relative: 13 % — ABNORMAL HIGH (ref 3–12)
Neutro Abs: 6.5 10*3/uL (ref 1.7–7.7)
RBC: 3.87 MIL/uL (ref 3.87–5.11)
WBC: 9.8 10*3/uL (ref 4.0–10.5)

## 2013-04-26 MED ORDER — DEXTROSE 5 % IV SOLN
1.0000 g | Freq: Once | INTRAVENOUS | Status: AC
  Administered 2013-04-26: 1 g via INTRAVENOUS
  Filled 2013-04-26: qty 10

## 2013-04-26 MED ORDER — CEPHALEXIN 500 MG PO CAPS: 500.0000 mg | ORAL_CAPSULE | Freq: Four times a day (QID) | ORAL | Status: DC

## 2013-04-26 NOTE — ED Provider Notes (Signed)
CSN: [id number]     Arrival date & time 04/26/13  1444 History    This chart was scribed for Loren Racer, MD by Quintella Reichert, ED scribe.  This patient was seen in room APA01/APA01 and the patient's care was started at 3:45 PM.     Chief Complaint  Patient presents with  . Joint Swelling    The history is provided by the patient. No language interpreter was used.    HPI Comments: Taylor Giles is a 79 y.o. female with h/o arthritis who presents to the Emergency Department complaining of severe swelling to the left elbow.  Pt reports that 2 weeks ago she tripped over a pillow case and hit her elbow.  She states that she was fine until 5 nights ago when she was sitting on the couch and suddenly felt something pop in the elbow.  Since then she has developed worsening swelling to the elbow that extends partway down the left arm.  She denies pain to the area.  Pt also complains of numbness to her left hand that began that same night.  She notes that she fractured that elbow when she was 6 and had a reduction to the elbow at age 89 as well as an ulnar nerve transposition at age 58.  She states "this elbow never looks good" but her present swelling is not typical for her.  She denies CP, SOB, or any other associated symptoms.  Pt has an appointment with her orthopedist tomorrow to evaluate her ankle osteoarthritis.     Past Medical History  Diagnosis Date  . Hypertension   . Chronic kidney disease     due to use of votaren, sees Dr. Kristian Covey  . Arthritis   . Gall stones   . PONV (postoperative nausea and vomiting)   . Hyperlipidemia   . Syncope   . Hypothyroidism   . Anemia   . Osteopenia   . Melanoma     left elbow  s/p excision 1982  . Mitral valve prolapse     Past Surgical History  Procedure Laterality Date  . Cataract extraction, bilateral    . Orif elbow fracture  age 23    after fx at age 19, Left  . Ulnar nerve transposition  age 42  . Bunionectomy      bilateral  .  Excision morton's neuroma      left?  . Parathyroid exploration  2004    removal of 2 adenoma tumors   . Thyroid surgery  1950    removal of tumor, adenoma  . Appendectomy  1967    with removal of ovarian cyst  . Ankle fusion  2009    Right  . Examination under anesthesia  12/11/2011    Procedure: EXAM UNDER ANESTHESIA;  Surgeon: Tilda Burrow, MD;  Location: [facility];  Service: Gynecology;  Laterality: N/A;  . Foreign body removal vaginal  12/11/2011    Procedure: REMOVAL FOREIGN BODY VAGINAL;  Surgeon: Tilda Burrow, MD;  Location: [facility];  Service: Gynecology;  Laterality: N/A;  impacted pessary  . Cystocele repair  12/11/2011    Procedure: ANTERIOR REPAIR (CYSTOCELE);  Surgeon: Tilda Burrow, MD;  Location: [facility];  Service: Gynecology;  Laterality: N/A;    Family History  Problem Relation Age of Onset  . Anesthesia problems Neg Hx   . Hypotension Neg Hx   . Pseudochol deficiency Neg Hx   . Malignant hyperthermia Neg Hx   . Hypertension Father   .  Heart attack Father   . Stroke Father   . Hypertension Brother   . Kidney disease Brother   . Heart attack Daughter     History  Substance Use Topics  . Smoking status: Current Every Day Smoker -- 0.25 packs/day for 60 years  . Smokeless tobacco: Not on file  . Alcohol Use: No    OB History   Grav Para Term Preterm Abortions TAB SAB Ect Mult Living                   Review of Systems  A complete 10 system review of systems was obtained and all systems are negative except as noted in the HPI and PMH.      Allergies  Ciprofloxacin; Gabapentin; Nsaids; Nitrofurantoin; and Terbinafine hcl  Home Medications   Current Outpatient Rx  Name  Route  Sig  Dispense  Refill  . Calcium Acetate 667 MG TABS   Oral   Take 667 mg by mouth 3 (three) times daily.         . cholecalciferol (VITAMIN D) 400 UNITS TABS   Oral   Take 1,000 Units by mouth daily.          Marland Kitchen doxazosin (CARDURA) 1 MG tablet   Oral   Take 1  mg by mouth 2 (two) times daily.         . hydrochlorothiazide (HYDRODIURIL) 25 MG tablet   Oral   Take 25 mg by mouth daily.         Marland Kitchen levothyroxine (SYNTHROID, LEVOTHROID) 112 MCG tablet   Oral   Take 112 mcg by mouth daily.         Marland Kitchen lovastatin (MEVACOR) 20 MG tablet   Oral   Take 20 mg by mouth at bedtime.         . Magnesium 250 MG TABS   Oral   Take 250 mg by mouth at bedtime.         . metoprolol succinate (TOPROL-XL) 100 MG 24 hr tablet   Oral   Take 100 mg by mouth daily. Take with or immediately following a meal.         . verapamil (CALAN-SR) 240 MG CR tablet   Oral   Take 240 mg by mouth at bedtime.         . vitamin B-12 (CYANOCOBALAMIN) 100 MCG tablet   Oral   Take 100 mcg by mouth daily.         . vitamin C (ASCORBIC ACID) 500 MG tablet   Oral   Take 500 mg by mouth daily.         Marland Kitchen acetaminophen (TYLENOL) 325 MG tablet   Oral   Take 650 mg by mouth 2 (two) times daily as needed. Aches and pain         . cephALEXin (KEFLEX) 500 MG capsule   Oral   Take 1 capsule (500 mg total) by mouth 4 (four) times daily.   28 capsule   0    BP 141/50  Pulse 63  Temp(Src) 98 F (36.7 C) (Oral)  Resp 16  Ht 5\' 8"  (1.727 m)  Wt 131 lb (59.421 kg)  BMI 19.92 kg/m2  SpO2 98%  Physical Exam  Nursing note and vitals reviewed. Constitutional: She is oriented to person, place, and time. She appears well-developed and well-nourished. No distress.  HENT:  Head: Normocephalic and atraumatic.  Eyes: EOM are normal.  Neck: Neck supple. No tracheal deviation present.  Cardiovascular: Normal rate and intact distal pulses.   Pulmonary/Chest: Effort normal. No respiratory distress.  Musculoskeletal: Normal range of motion.  Deformed left elbow with large effusion and distal arm swelling. Mild increased erythema to the elbow area without warmth or tenderness. No evidence of compartment syndrome.  Neurological: She is alert and oriented to person,  place, and time. No sensory deficit.  Decreased sensation radial nerve distribution. Good grip strength.  Skin: Skin is warm and dry.  2 small skin tears at the proximal forearm, draining serosanguinous fluid.  Psychiatric: She has a normal mood and affect. Her behavior is normal.    ED Course  Procedures (including critical care time)          Labs Reviewed  CBC WITH DIFFERENTIAL - Abnormal; Notable for the following:    Monocytes Relative 13 (*)    Monocytes Absolute 1.3 (*)    All other components within normal limits  BASIC METABOLIC PANEL - Abnormal; Notable for the following:    BUN 31 (*)    Creatinine, Ser 1.48 (*)    GFR calc non Af Amer 32 (*)    GFR calc Af Amer 37 (*)    All other components within normal limits  SEDIMENTATION RATE - Abnormal; Notable for the following:    Sed Rate 50 (*)    All other components within normal limits  C-REACTIVE PROTEIN    Dg Elbow Complete Left  04/26/2013   *RADIOLOGY REPORT*  Clinical Data: Chronic deformity secondary to remote fracture.  The patient heard a recent pop in the elbow and now has swelling and weak been of the scan around the left elbow.  LEFT ELBOW - COMPLETE 3+ VIEW  Comparison: None available.  Findings: A chronic fracture dislocation is evident.  Diffuse heterotopic ossification is present.  No acute fracture is evident. There is diffuse soft tissue swelling.  No radiopaque foreign body is evident.  IMPRESSION:  1.  Chronic fracture dislocation of the left elbow with extensive heterotopic ossification. 2.  No definite acute fracture is seen. 3.  Extensive soft tissue swelling is present.  No comparison films are available to assess change.   Original Report Authenticated By: Marin Roberts, M.D.   Ct Elbow Left W/o Cm  04/26/2013   *RADIOLOGY REPORT*  Clinical Data: Left elbow pain, swelling and deformity.  CT OF THE LEFT ELBOW WITHOUT CONTRAST  Technique:  Multidetector CT imaging was performed according to the  standard protocol. Multiplanar CT image reconstructions were also generated.  Comparison: Radiographs, same date.  Findings: Severe elbow deformity as seen on the radiographs likely due to chronic ununited fractures and dislocations.  No obvious acute fracture.  Probable large joint effusion with synovial thickening and calcifications.  Diffuse soft tissue swelling/subcutaneous edema and fluid.  IMPRESSION:  1.  Severe elbow deformity likely chronic ununited fractures and dislocations. 2.  No definite acute fracture. 3.  Diffuse subcutaneous soft tissue swelling/edema and fluid along with a large joint effusion and synovial thickening and calcification.   Original Report Authenticated By: Rudie Meyer, M.D.    1. Swelling of left elbow joint     MDM  I personally performed the services described in this documentation, which was scribed in my presence. The recorded information has been reviewed and is accurate.  Discussed with Dr Amanda Pea who reviewed xray and CT. Suggested posterior splint, Keflex and follow up tomorrow.    Loren Racer, MD 04/26/13 2124

## 2013-04-26 NOTE — ED Notes (Signed)
Pt states that she fell a few weeks ago after tripping over a pillow case hitting her left elbow when she fell, was doing fine until this past Tuesday when she felt something "pop" in her left elbow, pt has swelling to left elbow area that extends down left arm, has abrasion to left forearm that happened today after pt hit her arm against the chair, states that the area started draining yellow fluid that changed to red fluid, pt has bandage in place.

## 2013-06-04 ENCOUNTER — Other Ambulatory Visit: Payer: Self-pay | Admitting: Internal Medicine

## 2013-06-04 ENCOUNTER — Other Ambulatory Visit: Payer: Self-pay | Admitting: *Deleted

## 2013-06-10 ENCOUNTER — Ambulatory Visit (HOSPITAL_COMMUNITY)
Admission: RE | Admit: 2013-06-10 | Discharge: 2013-06-10 | Disposition: A | Discharge status: Home / Self Care | Payer: Medicare Other | Source: Ambulatory Visit | Attending: Family Medicine | Admitting: Family Medicine

## 2013-06-10 ENCOUNTER — Other Ambulatory Visit (HOSPITAL_COMMUNITY): Payer: Self-pay | Admitting: Family Medicine

## 2013-06-10 DIAGNOSIS — M25559 Pain in unspecified hip: Secondary | ICD-10-CM | POA: Insufficient documentation

## 2013-06-10 DIAGNOSIS — IMO0002 Reserved for concepts with insufficient information to code with codable children: Secondary | ICD-10-CM

## 2013-06-10 DIAGNOSIS — M169 Osteoarthritis of hip, unspecified: Secondary | ICD-10-CM

## 2013-06-16 ENCOUNTER — Other Ambulatory Visit (HOSPITAL_COMMUNITY): Payer: Self-pay | Admitting: Nephrology

## 2013-06-16 DIAGNOSIS — N289 Disorder of kidney and ureter, unspecified: Secondary | ICD-10-CM

## 2013-06-23 ENCOUNTER — Ambulatory Visit (HOSPITAL_COMMUNITY)
Admission: RE | Admit: 2013-06-23 | Discharge: 2013-06-23 | Disposition: A | Discharge status: Home / Self Care | Payer: Medicare Other | Source: Ambulatory Visit | Attending: Nephrology | Admitting: Nephrology

## 2013-06-23 DIAGNOSIS — N189 Chronic kidney disease, unspecified: Secondary | ICD-10-CM | POA: Insufficient documentation

## 2013-06-23 DIAGNOSIS — N289 Disorder of kidney and ureter, unspecified: Secondary | ICD-10-CM

## 2013-08-25 ENCOUNTER — Other Ambulatory Visit: Payer: Self-pay | Admitting: *Deleted

## 2013-08-25 MED ORDER — DOXAZOSIN MESYLATE 1 MG PO TABS: 1.0000 mg | ORAL_TABLET | Freq: Two times a day (BID) | ORAL | Status: DC

## 2013-08-28 ENCOUNTER — Ambulatory Visit: Payer: Medicare Other | Admitting: Internal Medicine

## 2013-09-02 ENCOUNTER — Ambulatory Visit: Payer: Medicare Other | Admitting: Internal Medicine

## 2013-09-25 ENCOUNTER — Ambulatory Visit (INDEPENDENT_AMBULATORY_CARE_PROVIDER_SITE_OTHER): Payer: Medicare Other | Admitting: Urology

## 2013-09-25 ENCOUNTER — Encounter (INDEPENDENT_AMBULATORY_CARE_PROVIDER_SITE_OTHER): Payer: Self-pay

## 2013-09-25 DIAGNOSIS — N3941 Urge incontinence: Secondary | ICD-10-CM

## 2013-09-25 DIAGNOSIS — R82998 Other abnormal findings in urine: Secondary | ICD-10-CM

## 2013-10-05 ENCOUNTER — Encounter: Payer: Self-pay | Admitting: Internal Medicine

## 2013-10-05 ENCOUNTER — Ambulatory Visit (INDEPENDENT_AMBULATORY_CARE_PROVIDER_SITE_OTHER): Payer: Medicare Other | Admitting: Internal Medicine

## 2013-10-05 VITALS — BP 112/60 | HR 43 | Ht 66.0 in | Wt 131.6 lb

## 2013-10-05 DIAGNOSIS — I498 Other specified cardiac arrhythmias: Secondary | ICD-10-CM

## 2013-10-05 DIAGNOSIS — R001 Bradycardia, unspecified: Secondary | ICD-10-CM | POA: Insufficient documentation

## 2013-10-05 DIAGNOSIS — E785 Hyperlipidemia, unspecified: Secondary | ICD-10-CM

## 2013-10-05 DIAGNOSIS — I10 Essential (primary) hypertension: Secondary | ICD-10-CM

## 2013-10-05 NOTE — Progress Notes (Signed)
OFFICE NOTE  Chief Complaint:  Routine followup, left ankle swelling  Primary Care Physician: Purvis Kilts, MD  HPI:  Taylor Giles is a pleasant 80 year old female with a syncopal episode of unknown etiology. She also has also chronic kidney disease and has hypertension which has been labile. She has also had lower extremity edema for which she has been wearing compression stockings. Her renal function actually worsened on HCTZ, but now is improved. Her blood pressure however is still not adequately controlled.   she complains of some mild low lower extremity swelling which is minimal and continues to wear her compression stockings as much as she can.  She reports feeling fairly well and has had no further syncopal events. Her blood pressure actually is much better controlled today 112/60. Forcefully she reported recently she had a blood pressure up to 510 systolic, which demonstrates probably some labile hypertension. Her heart rate, interestingly is actually fairly low today at 43. There is no evidence for delayed AV conduction however there is a first-degree AV block with PR interval of 232 ms. She has maintained a heart rate in the 50s before, however this is lower than typical for her and she is still having some problems with positional dizziness.  PMHx:  Past Medical History  Diagnosis Date  . Hypertension   . Chronic kidney disease     due to use of votaren, sees Dr. Lowanda Foster  . Arthritis   . Gall stones   . PONV (postoperative nausea and vomiting)   . Hyperlipidemia   . Syncope   . Hypothyroidism   . Anemia   . Osteopenia   . Melanoma     left elbow  s/p excision 1982  . Mitral valve prolapse     Past Surgical History  Procedure Laterality Date  . Cataract extraction, bilateral    . Orif elbow fracture  age 22    after fx at age 15, Left  . Ulnar nerve transposition  age 66  . Bunionectomy      bilateral  . Excision morton's neuroma      left?  . Parathyroid  exploration  2004    removal of 2 adenoma tumors   . Thyroid surgery  1950    removal of tumor, adenoma  . Appendectomy  1967    with removal of ovarian cyst  . Ankle fusion  2009    Right  . Examination under anesthesia  12/11/2011    Procedure: EXAM UNDER ANESTHESIA;  Surgeon: Jonnie Kind, MD;  Location: [facility];  Service: Gynecology;  Laterality: N/A;  . Foreign body removal vaginal  12/11/2011    Procedure: REMOVAL FOREIGN BODY VAGINAL;  Surgeon: Jonnie Kind, MD;  Location: [facility];  Service: Gynecology;  Laterality: N/A;  impacted pessary  . Cystocele repair  12/11/2011    Procedure: ANTERIOR REPAIR (CYSTOCELE);  Surgeon: Jonnie Kind, MD;  Location: [facility];  Service: Gynecology;  Laterality: N/A;    FAMHx:  Family History  Problem Relation Age of Onset  . Anesthesia problems Neg Hx   . Hypotension Neg Hx   . Pseudochol deficiency Neg Hx   . Malignant hyperthermia Neg Hx   . Hypertension Father   . Heart attack Father   . Stroke Father   . Hypertension Brother   . Kidney disease Brother   . Heart attack Daughter     SOCHx:   reports that she has been smoking.  She has never used smokeless tobacco.  She reports that she does not drink alcohol or use illicit drugs.  ALLERGIES:  Allergies  Allergen Reactions  . Ciprofloxacin     REACTION: itchy rash  . Gabapentin   . Nsaids     Nephrologist ordered not to take d/t chronic kidney disease  . Nitrofurantoin Rash    macrobid  . Terbinafine Hcl Rash    ROS: A comprehensive review of systems was negative except for: Cardiovascular: positive for lower extremity edema Neurological: positive for dizziness  HOME MEDS: Current Outpatient Prescriptions  Medication Sig Dispense Refill  . acetaminophen (TYLENOL) 325 MG tablet Take 650 mg by mouth 2 (two) times daily as needed. Aches and pain      . Calcium Acetate 667 MG TABS Take 667 mg by mouth 3 (three) times daily.      . Cholecalciferol (VITAMIN D-3) 1000 UNITS  CAPS Take 1,000 Units by mouth daily.      Marland Kitchen doxazosin (CARDURA) 1 MG tablet Take 1 tablet (1 mg total) by mouth 2 (two) times daily.  30 tablet  6  . hydrochlorothiazide (HYDRODIURIL) 25 MG tablet Take 25 mg by mouth daily.      Marland Kitchen levothyroxine (SYNTHROID, LEVOTHROID) 112 MCG tablet Take 112 mcg by mouth daily.      Marland Kitchen lovastatin (MEVACOR) 20 MG tablet take 1 tablet by mouth once daily  30 tablet  8  . Magnesium 250 MG TABS Take 250 mg by mouth at bedtime.      . metoprolol succinate (TOPROL-XL) 100 MG 24 hr tablet Take 50 mg by mouth daily. Take with or immediately following a meal.      . Misc Natural Products (COLON CARE PO) Take 1 capsule by mouth 2 (two) times daily as needed.      . Multiple Vitamins-Minerals (MULTIVITAL) tablet Take 1 tablet by mouth daily. Healthy Vision Complete      . oxybutynin (DITROPAN) 5 MG tablet Take 5 mg by mouth at bedtime.      . verapamil (CALAN-SR) 240 MG CR tablet Take 240 mg by mouth at bedtime.      . vitamin B-12 (CYANOCOBALAMIN) 100 MCG tablet Take 100 mcg by mouth daily.      . vitamin C (ASCORBIC ACID) 500 MG tablet Take 500 mg by mouth daily.       No current facility-administered medications for this visit.    LABS/IMAGING: No results found for this or any previous visit (from the past 48 hour(s)). No results found.  VITALS: BP 112/60  Pulse 43  Ht 5\' 6"  (1.676 m)  Wt 131 lb 9.6 oz (59.693 kg)  BMI 21.25 kg/m2  EXAM: General appearance: alert and no distress Neck: no adenopathy, no carotid bruit, no JVD, supple, symmetrical, trachea midline and thyroid not enlarged, symmetric, no tenderness/mass/nodules Lungs: clear to auscultation bilaterally Heart: regular rate and rhythm, S1, S2 normal, no murmur, click, rub or gallop Abdomen: soft, non-tender; bowel sounds normal; no masses,  no organomegaly Extremities: edema Trace left lower, compression stockings in place Pulses: 2+ and symmetric Skin: Skin color, texture, turgor normal. No  rashes or lesions Neurologic: Grossly normal  EKG: Sinus bradycardia with first degree AV block at 43  ASSESSMENT: 1. Hypertension- at goal 2. Bradycardia with first degree AV block - possible symptomatic 3. Dyslipidemia - at goal.  4. Lower extremity edema  PLAN: 1.    Mrs. Bran seems to be doing fairly well, except that she seems to have a progressively worsening bradycardia and still  has some symptoms of dizziness. I would recommend decreasing her Toprol-XL to 50 mg (one half tablet) daily. She should keep track of her heart rate and advise our office if it does not improve.  Her blood pressure is much better controlled today but has some elements of lability, probably due to arteriosclerosis and dysautonomia. She will be getting a repeat lipid profile in April and will obtain those results from her primary care provider. She has been well controlled with LDL of 55 on low dose lovastatin.  Plan return in 6 months.  Pixie Casino, MD, [facility] Attending Cardiologist [facility] [place] 10/05/2013, 9:50 AM

## 2013-10-05 NOTE — Patient Instructions (Addendum)
Your physician has recommended you make the following change in your medication: DECREASE Metoprolol Succinate to 50mg  daily. (1/2 of 100mg  tablets)  Please call our office with your heart rate in 1 week.   Your physician wants you to follow-up in: 6 months. You will receive a reminder letter in the mail two months in advance. If you don't receive a letter, please call our office to schedule the follow-up appointment.  Please have Dr. Hilma Favors send your lab work to our office. Thank you.

## 2013-12-04 ENCOUNTER — Ambulatory Visit (INDEPENDENT_AMBULATORY_CARE_PROVIDER_SITE_OTHER): Payer: Medicare Other | Admitting: Urology

## 2013-12-04 DIAGNOSIS — N3941 Urge incontinence: Secondary | ICD-10-CM

## 2013-12-04 DIAGNOSIS — R82998 Other abnormal findings in urine: Secondary | ICD-10-CM

## 2014-01-13 NOTE — Telephone Encounter (Signed)
Encounter has been closed--TP 01/13/14 

## 2014-01-28 ENCOUNTER — Ambulatory Visit (HOSPITAL_COMMUNITY)
Admission: RE | Admit: 2014-01-28 | Discharge: 2014-01-28 | Disposition: A | Discharge status: Home / Self Care | Payer: Medicare Other | Source: Ambulatory Visit | Attending: Nephrology | Admitting: Nephrology

## 2014-01-28 ENCOUNTER — Other Ambulatory Visit (HOSPITAL_COMMUNITY): Payer: Self-pay | Admitting: Nephrology

## 2014-01-28 DIAGNOSIS — N289 Disorder of kidney and ureter, unspecified: Secondary | ICD-10-CM | POA: Insufficient documentation

## 2014-01-28 DIAGNOSIS — N281 Cyst of kidney, acquired: Secondary | ICD-10-CM

## 2014-02-05 ENCOUNTER — Ambulatory Visit (INDEPENDENT_AMBULATORY_CARE_PROVIDER_SITE_OTHER): Payer: Medicare Other | Admitting: Urology

## 2014-02-05 DIAGNOSIS — N3941 Urge incontinence: Secondary | ICD-10-CM

## 2014-04-05 ENCOUNTER — Ambulatory Visit: Payer: Medicare Other | Admitting: Internal Medicine

## 2014-09-01 ENCOUNTER — Other Ambulatory Visit: Payer: Self-pay | Admitting: Family Medicine

## 2014-09-01 DIAGNOSIS — N6320 Unspecified lump in the left breast, unspecified quadrant: Secondary | ICD-10-CM

## 2014-09-13 ENCOUNTER — Other Ambulatory Visit (HOSPITAL_COMMUNITY): Payer: Self-pay | Admitting: Family Medicine

## 2014-09-13 ENCOUNTER — Ambulatory Visit (HOSPITAL_COMMUNITY)
Admission: RE | Admit: 2014-09-13 | Discharge: 2014-09-13 | Disposition: A | Discharge status: Home / Self Care | Payer: Medicare Other | Source: Ambulatory Visit | Attending: Family Medicine | Admitting: Family Medicine

## 2014-09-13 DIAGNOSIS — J189 Pneumonia, unspecified organism: Secondary | ICD-10-CM

## 2014-09-13 DIAGNOSIS — I517 Cardiomegaly: Secondary | ICD-10-CM | POA: Diagnosis not present

## 2014-09-13 DIAGNOSIS — R918 Other nonspecific abnormal finding of lung field: Secondary | ICD-10-CM | POA: Insufficient documentation

## 2014-09-13 DIAGNOSIS — J90 Pleural effusion, not elsewhere classified: Secondary | ICD-10-CM | POA: Diagnosis not present

## 2014-09-29 ENCOUNTER — Ambulatory Visit (INDEPENDENT_AMBULATORY_CARE_PROVIDER_SITE_OTHER): Payer: Medicare Other | Admitting: Internal Medicine

## 2014-09-29 ENCOUNTER — Encounter: Payer: Self-pay | Admitting: Internal Medicine

## 2014-09-29 VITALS — BP 150/84 | HR 54 | Ht 68.0 in | Wt 138.0 lb

## 2014-09-29 DIAGNOSIS — R001 Bradycardia, unspecified: Secondary | ICD-10-CM

## 2014-09-29 DIAGNOSIS — E785 Hyperlipidemia, unspecified: Secondary | ICD-10-CM

## 2014-09-29 DIAGNOSIS — R55 Syncope and collapse: Secondary | ICD-10-CM

## 2014-09-29 DIAGNOSIS — I10 Essential (primary) hypertension: Secondary | ICD-10-CM

## 2014-09-29 NOTE — Progress Notes (Signed)
OFFICE NOTE  Chief Complaint:  Routine follow-up, recently had pneumonia  Primary Care Physician: Purvis Kilts, MD  HPI:  Taylor Giles is a pleasant 81 year old female with a syncopal episode of unknown etiology. She also has also chronic kidney disease and has hypertension which has been labile. She has also had lower extremity edema for which she has been wearing compression stockings. Her renal function actually worsened on HCTZ, but now is improved. Her blood pressure however is still not adequately controlled.   she complains of some mild low lower extremity swelling which is minimal and continues to wear her compression stockings as much as she can.  She reports feeling fairly well and has had no further syncopal events. Her blood pressure actually is much better controlled today 112/60. Forcefully she reported recently she had a blood pressure up to 852 systolic, which demonstrates probably some labile hypertension. Her heart rate, interestingly is actually fairly low today at 43. There is no evidence for delayed AV conduction however there is a first-degree AV block with PR interval of 232 ms. She has maintained a heart rate in the 50s before, however this is lower than typical for her and she is still having some problems with positional dizziness.  Taylor Giles returns today for follow-up. She tells me that at Thanksgiving she developed a pneumonia. She was seen in urgent care and told she had a walking pneumonia and was eventually admitted to the hospital for antibiotic therapy. She improved fairly quickly. She denies any further syncopal events. At her last office visit I decreased her beta blocker due to bradycardia and there has been some improvement in her heart rate. Blood pressure is high normal today which is appropriate. She did have a CT scan performed at [facility] to evaluate for her pneumonia. Coronary artery calcifications were noted.  PMHx:  Past Medical History   Diagnosis Date  . Hypertension   . Chronic kidney disease     due to use of votaren, sees Dr. Lowanda Foster  . Arthritis   . Gall stones   . PONV (postoperative nausea and vomiting)   . Hyperlipidemia   . Syncope   . Hypothyroidism   . Anemia   . Osteopenia   . Melanoma     left elbow  s/p excision 1982  . Mitral valve prolapse     Past Surgical History  Procedure Laterality Date  . Cataract extraction, bilateral    . Orif elbow fracture  age 67    after fx at age 54, Left  . Ulnar nerve transposition  age 60  . Bunionectomy      bilateral  . Excision morton's neuroma      left?  . Parathyroid exploration  2004    removal of 2 adenoma tumors   . Thyroid surgery  1950    removal of tumor, adenoma  . Appendectomy  1967    with removal of ovarian cyst  . Ankle fusion  2009    Right  . Examination under anesthesia  12/11/2011    Procedure: EXAM UNDER ANESTHESIA;  Surgeon: Jonnie Kind, MD;  Location: [facility];  Service: Gynecology;  Laterality: N/A;  . Foreign body removal vaginal  12/11/2011    Procedure: REMOVAL FOREIGN BODY VAGINAL;  Surgeon: Jonnie Kind, MD;  Location: [facility];  Service: Gynecology;  Laterality: N/A;  impacted pessary  . Cystocele repair  12/11/2011    Procedure: ANTERIOR REPAIR (CYSTOCELE);  Surgeon: Jonnie Kind, MD;  Location: [facility];  Service: Gynecology;  Laterality: N/A;    FAMHx:  Family History  Problem Relation Age of Onset  . Anesthesia problems Neg Hx   . Hypotension Neg Hx   . Pseudochol deficiency Neg Hx   . Malignant hyperthermia Neg Hx   . Hypertension Father   . Heart attack Father   . Stroke Father   . Hypertension Brother   . Kidney disease Brother   . Heart attack Daughter     SOCHx:   reports that she has been smoking.  She has never used smokeless tobacco. She reports that she does not drink alcohol or use illicit drugs.  ALLERGIES:  Allergies  Allergen Reactions  . Ciprofloxacin     REACTION: itchy rash  .  Gabapentin   . Nsaids     Nephrologist ordered not to take d/t chronic kidney disease  . Nitrofurantoin Rash    macrobid  . Terbinafine Hcl Rash    ROS: A comprehensive review of systems was negative except for: Cardiovascular: positive for lower extremity edema Neurological: positive for dizziness  HOME MEDS: Current Outpatient Prescriptions  Medication Sig Dispense Refill  . acetaminophen (TYLENOL) 325 MG tablet Take 650 mg by mouth 2 (two) times daily as needed. Aches and pain    . Calcium Acetate 667 MG TABS Take 667 mg by mouth 3 (three) times daily.    . Cholecalciferol (VITAMIN D-3) 1000 UNITS CAPS Take 1,000 Units by mouth daily.    Marland Kitchen doxazosin (CARDURA) 1 MG tablet Take 1 tablet (1 mg total) by mouth 2 (two) times daily. 30 tablet 6  . levothyroxine (SYNTHROID, LEVOTHROID) 112 MCG tablet Take 112 mcg by mouth daily.    . Magnesium 250 MG TABS Take 250 mg by mouth at bedtime.    . metoprolol succinate (TOPROL-XL) 100 MG 24 hr tablet Take 50 mg by mouth daily. Take with or immediately following a meal.    . Misc Natural Products (COLON CARE PO) Take 1 capsule by mouth 2 (two) times daily as needed.    . Multiple Vitamins-Minerals (MULTIVITAL) tablet Take 1 tablet by mouth daily. Healthy Vision Complete    . oxybutynin (DITROPAN) 5 MG tablet Take 5 mg by mouth at bedtime.    . verapamil (CALAN-SR) 240 MG CR tablet Take 240 mg by mouth at bedtime.    . vitamin B-12 (CYANOCOBALAMIN) 100 MCG tablet Take 100 mcg by mouth daily.    . vitamin C (ASCORBIC ACID) 500 MG tablet Take 500 mg by mouth daily.    Marland Kitchen MYRBETRIQ 25 MG TB24 tablet Take 25 mg by mouth at bedtime.     No current facility-administered medications for this visit.    LABS/IMAGING: No results found for this or any previous visit (from the past 48 hour(s)). No results found.  VITALS: BP 150/84 mmHg  Pulse 54  Ht 5\' 8"  (1.727 m)  Wt 138 lb (62.596 kg)  BMI 20.99 kg/m2  EXAM: General appearance: alert and no  distress Neck: no adenopathy, no carotid bruit, no JVD, supple, symmetrical, trachea midline and thyroid not enlarged, symmetric, no tenderness/mass/nodules Lungs: clear to auscultation bilaterally Heart: regular rate and rhythm, S1, S2 normal, no murmur, click, rub or gallop Abdomen: soft, non-tender; bowel sounds normal; no masses,  no organomegaly Extremities: edema Trace left lower, compression stockings in place Pulses: 2+ and symmetric Skin: Skin color, texture, turgor normal. No rashes or lesions Neurologic: Grossly normal  EKG: Sinus bradycardia at 54  ASSESSMENT: 1.  Hypertension- at goal 2. Bradycardia with first degree AV block - improved on lower dose metoprolol 3. Dyslipidemia - at goal.  4. Lower extremity edema  PLAN: 1.    Taylor Giles has not had any further syncopal events. Her blood pressure is high normal which is most appropriate. She's had some labile blood pressures and therefore I would not try to get strict control of her blood pressure. Heart rate is improved now after decreasing her beta blocker. Unfortunately she recently had a pneumonia but did was treated and is recovering. Her cholesterol was recently assessed as well showing total cholesterol 147, triglycerides 117, HDL 64 and LDL 60. No changes will be made to her medications. Plan to see her back in 6 months or sooner as necessary.  Pixie Casino, MD, [facility] Attending Cardiologist [facility] [place] [facility] 09/29/2014, 5:04 PM

## 2014-09-29 NOTE — Patient Instructions (Signed)
Your physician wants you to follow-up in: 6 months with Dr. Hilty. You will receive a reminder letter in the mail two months in advance. If you don't receive a letter, please call our office to schedule the follow-up appointment.    

## 2014-10-08 ENCOUNTER — Other Ambulatory Visit: Payer: Medicare Other

## 2014-10-08 ENCOUNTER — Inpatient Hospital Stay: Admission: RE | Admit: 2014-10-08 | Payer: Medicare Other | Source: Ambulatory Visit

## 2014-10-11 ENCOUNTER — Other Ambulatory Visit (HOSPITAL_COMMUNITY): Payer: Self-pay | Admitting: Family Medicine

## 2014-10-11 DIAGNOSIS — N6320 Unspecified lump in the left breast, unspecified quadrant: Secondary | ICD-10-CM

## 2014-10-12 ENCOUNTER — Encounter (HOSPITAL_COMMUNITY): Payer: Medicare Other

## 2014-10-15 ENCOUNTER — Ambulatory Visit: Payer: Medicare Other | Admitting: Urology

## 2014-10-19 ENCOUNTER — Ambulatory Visit (HOSPITAL_COMMUNITY)
Admission: RE | Admit: 2014-10-19 | Discharge: 2014-10-19 | Disposition: A | Discharge status: Home / Self Care | Payer: Medicare Other | Source: Ambulatory Visit | Attending: Family Medicine | Admitting: Family Medicine

## 2014-10-19 DIAGNOSIS — N6002 Solitary cyst of left breast: Secondary | ICD-10-CM | POA: Insufficient documentation

## 2014-10-19 DIAGNOSIS — N6320 Unspecified lump in the left breast, unspecified quadrant: Secondary | ICD-10-CM

## 2014-10-19 DIAGNOSIS — R928 Other abnormal and inconclusive findings on diagnostic imaging of breast: Secondary | ICD-10-CM | POA: Diagnosis present

## 2014-10-26 ENCOUNTER — Ambulatory Visit (HOSPITAL_COMMUNITY)
Admission: RE | Admit: 2014-10-26 | Discharge: 2014-10-26 | Disposition: A | Discharge status: Home / Self Care | Payer: Medicare Other | Source: Ambulatory Visit | Attending: Family Medicine | Admitting: Family Medicine

## 2014-10-26 ENCOUNTER — Other Ambulatory Visit (HOSPITAL_COMMUNITY): Payer: Self-pay | Admitting: Family Medicine

## 2014-10-26 DIAGNOSIS — M25461 Effusion, right knee: Secondary | ICD-10-CM | POA: Insufficient documentation

## 2014-10-26 DIAGNOSIS — M11261 Other chondrocalcinosis, right knee: Secondary | ICD-10-CM | POA: Diagnosis not present

## 2014-10-26 DIAGNOSIS — M25561 Pain in right knee: Secondary | ICD-10-CM

## 2014-10-26 DIAGNOSIS — M7041 Prepatellar bursitis, right knee: Secondary | ICD-10-CM

## 2014-11-19 ENCOUNTER — Ambulatory Visit: Payer: Medicare Other | Admitting: Urology

## 2015-02-18 ENCOUNTER — Ambulatory Visit (INDEPENDENT_AMBULATORY_CARE_PROVIDER_SITE_OTHER): Payer: Medicare Other | Admitting: Urology

## 2015-02-18 DIAGNOSIS — N3941 Urge incontinence: Secondary | ICD-10-CM

## 2015-02-18 DIAGNOSIS — N390 Urinary tract infection, site not specified: Secondary | ICD-10-CM

## 2015-03-30 ENCOUNTER — Encounter: Payer: Self-pay | Admitting: Internal Medicine

## 2015-03-30 ENCOUNTER — Ambulatory Visit (INDEPENDENT_AMBULATORY_CARE_PROVIDER_SITE_OTHER): Payer: Medicare Other | Admitting: Internal Medicine

## 2015-03-30 VITALS — BP 116/64 | HR 54 | Ht 64.5 in | Wt 142.7 lb

## 2015-03-30 DIAGNOSIS — I10 Essential (primary) hypertension: Secondary | ICD-10-CM

## 2015-03-30 DIAGNOSIS — R001 Bradycardia, unspecified: Secondary | ICD-10-CM | POA: Diagnosis not present

## 2015-03-30 DIAGNOSIS — E785 Hyperlipidemia, unspecified: Secondary | ICD-10-CM | POA: Diagnosis not present

## 2015-03-30 NOTE — Patient Instructions (Signed)
Your physician wants you to follow-up in: 6 months with Dr. Hilty. You will receive a reminder letter in the mail two months in advance. If you don't receive a letter, please call our office to schedule the follow-up appointment.    

## 2015-03-30 NOTE — Progress Notes (Signed)
OFFICE NOTE  Chief Complaint:  No complaints  Primary Care Physician: Purvis Kilts, MD  HPI:  Taylor Giles is a pleasant 80 year old female with a syncopal episode of unknown etiology. She also has also chronic kidney disease and has hypertension which has been labile. She has also had lower extremity edema for which she has been wearing compression stockings. Her renal function actually worsened on HCTZ, but now is improved. Her blood pressure however is still not adequately controlled.   she complains of some mild low lower extremity swelling which is minimal and continues to wear her compression stockings as much as she can.  She reports feeling fairly well and has had no further syncopal events. Her blood pressure actually is much better controlled today 112/60. Forcefully she reported recently she had a blood pressure up to 353 systolic, which demonstrates probably some labile hypertension. Her heart rate, interestingly is actually fairly low today at 43. There is no evidence for delayed AV conduction however there is a first-degree AV block with PR interval of 232 ms. She has maintained a heart rate in the 50s before, however this is lower than typical for her and she is still having some problems with positional dizziness.  Taylor Giles returns today for follow-up. She tells me that at Thanksgiving she developed a pneumonia. She was seen in urgent care and told she had a walking pneumonia and was eventually admitted to the hospital for antibiotic therapy. She improved fairly quickly. She denies any further syncopal events. At her last office visit I decreased her beta blocker due to bradycardia and there has been some improvement in her heart rate. Blood pressure is high normal today which is appropriate. She did have a CT scan performed at [facility] to evaluate for her pneumonia. Coronary artery calcifications were noted.  I saw Taylor Giles acted in the office. Overall she is doing well  denies any new chest pain or worsening shortness of breath. She tells me in February of this past year she was visiting a family member and [facility] and had an unresponsive episode. She went through a thorough workup including head MRI as well as an echocardiogram. The echo showed normal LV systolic function however there was diastolic dysfunction, mild pulmonary hypertension and an echo density noted on the right atrium which "appeared to be attached to the lateral wall of the right atrium and is mobile". TEE was recommended and performed. This demonstrated a linear echodensity extending from the SVC. 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 Ultimately she was found to have a UTI as the cause of her symptoms.  PMHx:  Past Medical History  Diagnosis Date  . Hypertension   . Chronic kidney disease     due to use of votaren, sees Dr. Lowanda Foster  . Arthritis   . Gall stones   . PONV (postoperative nausea and vomiting)   . Hyperlipidemia   . Syncope   . Hypothyroidism   . Anemia   . Osteopenia   . Melanoma     left elbow  s/p excision 1982  . Mitral valve prolapse     Past Surgical History  Procedure Laterality Date  . Cataract extraction, bilateral    . Orif elbow fracture  age 43    after fx at age 20, Left  . Ulnar nerve transposition  age 56  . Bunionectomy      bilateral  .  Excision morton's neuroma      left?  . Parathyroid exploration  2004    removal of 2 adenoma tumors   . Thyroid surgery  1950    removal of tumor, adenoma  . Appendectomy  1967    with removal of ovarian cyst  . Ankle fusion  2009    Right  . Examination under anesthesia  12/11/2011    Procedure: EXAM UNDER ANESTHESIA;  Surgeon: Jonnie Kind, MD;  Location: [facility];  Service: Gynecology;  Laterality: N/A;  . Foreign body removal vaginal  12/11/2011    Procedure: REMOVAL FOREIGN BODY VAGINAL;  Surgeon: Jonnie Kind, MD;   Location: [facility];  Service: Gynecology;  Laterality: N/A;  impacted pessary  . Cystocele repair  12/11/2011    Procedure: ANTERIOR REPAIR (CYSTOCELE);  Surgeon: Jonnie Kind, MD;  Location: [facility];  Service: Gynecology;  Laterality: N/A;    FAMHx:  Family History  Problem Relation Age of Onset  . Anesthesia problems Neg Hx   . Hypotension Neg Hx   . Pseudochol deficiency Neg Hx   . Malignant hyperthermia Neg Hx   . Hypertension Father   . Heart attack Father   . Stroke Father   . Hypertension Brother   . Kidney disease Brother   . Heart attack Daughter     SOCHx:   reports that she quit smoking about 7 months ago. She has never used smokeless tobacco. She reports that she does not drink alcohol or use illicit drugs.  ALLERGIES:  Allergies  Allergen Reactions  . Ciprofloxacin     REACTION: itchy rash  . Gabapentin   . Nsaids     Nephrologist ordered not to take d/t chronic kidney disease  . Nitrofurantoin Rash    macrobid  . Terbinafine Hcl Rash    ROS: A comprehensive review of systems was negative.  HOME MEDS: Current Outpatient Prescriptions  Medication Sig Dispense Refill  . acetaminophen (TYLENOL) 325 MG tablet Take 1,300 mg by mouth every morning. And as needed    . Calcium Acetate 667 MG TABS Take 667 mg by mouth 3 (three) times daily.    . Cholecalciferol (VITAMIN D-3) 1000 UNITS CAPS Take 1,000 Units by mouth daily.    Marland Kitchen doxazosin (CARDURA) 1 MG tablet Take 1 tablet (1 mg total) by mouth 2 (two) times daily. 30 tablet 6  . hydrochlorothiazide (HYDRODIURIL) 25 MG tablet Take 25 mg by mouth daily.    Marland Kitchen HYDROcodone-acetaminophen (NORCO/VICODIN) 5-325 MG per tablet Take 1 tablet by mouth every 4 (four) hours as needed for moderate pain.    Marland Kitchen levothyroxine (SYNTHROID, LEVOTHROID) 112 MCG tablet Take 112 mcg by mouth daily.    Marland Kitchen lovastatin (MEVACOR) 20 MG tablet Take 20 mg by mouth at bedtime.    . Magnesium 250 MG TABS Take 250 mg by mouth at bedtime.    .  metoprolol succinate (TOPROL-XL) 100 MG 24 hr tablet Take 50 mg by mouth daily. Take with or immediately following a meal.    . Misc Natural Products (COLON CARE PO) Take 1 capsule by mouth 2 (two) times daily as needed.    . Multiple Vitamins-Minerals (MULTIVITAL) tablet Take 1 tablet by mouth daily. Healthy Vision Complete    . MYRBETRIQ 25 MG TB24 tablet Take 25 mg by mouth at bedtime.    Marland Kitchen oxybutynin (DITROPAN) 5 MG tablet Take 5 mg by mouth at bedtime.    . verapamil (CALAN-SR) 240 MG CR tablet Take 240  mg by mouth at bedtime.    . vitamin B-12 (CYANOCOBALAMIN) 100 MCG tablet Take 100 mcg by mouth daily.    . vitamin C (ASCORBIC ACID) 500 MG tablet Take 500 mg by mouth daily.     No current facility-administered medications for this visit.    LABS/IMAGING: No results found for this or any previous visit (from the past 48 hour(s)). No results found.  VITALS: BP 116/64 mmHg  Pulse 54  Ht 5' 4.5" (1.638 m)  Wt 142 lb 11.2 oz (64.728 kg)  BMI 24.12 kg/m2  EXAM: General appearance: alert and no distress Neck: no adenopathy, no carotid bruit, no JVD, supple, symmetrical, trachea midline and thyroid not enlarged, symmetric, no tenderness/mass/nodules Lungs: clear to auscultation bilaterally Heart: regular rate and rhythm, S1, S2 normal, no murmur, click, rub or gallop Abdomen: soft, non-tender; bowel sounds normal; no masses,  no organomegaly Extremities: edema Trace left lower, compression stockings in place Pulses: 2+ and symmetric Skin: Skin color, texture, turgor normal. No rashes or lesions Neurologic: Grossly normal  EKG: Sinus bradycardia with 1st degree AVB at 54  ASSESSMENT: 1. Hypertension- at goal 2. Bradycardia with first degree AV block - improved on lower dose metoprolol 3. Dyslipidemia - at goal.  4. Lower extremity edema 5. Syncope - secondary to UTI, normal LV systolic function by echo  PLAN: 1.    Taylor Giles seems to be doing better. She's not had any  more events since her UTI episode in February. Heart rate is bradycardic but improved after reducing her metoprolol. Cholesterol has been a goal. Her leg swelling is minimal at the sock line. Plan to continue her current medications and we'll see her back in 6 months  Pixie Casino, MD, [facility] Attending Cardiologist [place] 03/30/2015, 6:34 PM

## 2015-04-05 ENCOUNTER — Encounter: Payer: Self-pay | Admitting: Internal Medicine

## 2015-04-12 ENCOUNTER — Encounter: Payer: Self-pay | Admitting: Internal Medicine

## 2015-09-25 DIAGNOSIS — Z471 Aftercare following joint replacement surgery: Secondary | ICD-10-CM | POA: Diagnosis not present

## 2015-09-25 DIAGNOSIS — S81801D Unspecified open wound, right lower leg, subsequent encounter: Secondary | ICD-10-CM | POA: Diagnosis not present

## 2015-09-25 DIAGNOSIS — M1711 Unilateral primary osteoarthritis, right knee: Secondary | ICD-10-CM | POA: Diagnosis not present

## 2015-09-25 DIAGNOSIS — R5381 Other malaise: Secondary | ICD-10-CM | POA: Diagnosis not present

## 2015-09-25 DIAGNOSIS — L03115 Cellulitis of right lower limb: Secondary | ICD-10-CM | POA: Diagnosis not present

## 2015-09-25 DIAGNOSIS — I10 Essential (primary) hypertension: Secondary | ICD-10-CM | POA: Diagnosis not present

## 2015-09-25 DIAGNOSIS — R262 Difficulty in walking, not elsewhere classified: Secondary | ICD-10-CM | POA: Diagnosis not present

## 2015-09-25 DIAGNOSIS — R600 Localized edema: Secondary | ICD-10-CM | POA: Diagnosis not present

## 2015-09-25 DIAGNOSIS — Z96651 Presence of right artificial knee joint: Secondary | ICD-10-CM | POA: Diagnosis not present

## 2015-09-25 DIAGNOSIS — N183 Chronic kidney disease, stage 3 (moderate): Secondary | ICD-10-CM | POA: Diagnosis not present

## 2015-09-26 DIAGNOSIS — S81801D Unspecified open wound, right lower leg, subsequent encounter: Secondary | ICD-10-CM | POA: Diagnosis not present

## 2015-09-26 DIAGNOSIS — N183 Chronic kidney disease, stage 3 (moderate): Secondary | ICD-10-CM | POA: Diagnosis not present

## 2015-09-26 DIAGNOSIS — I10 Essential (primary) hypertension: Secondary | ICD-10-CM | POA: Diagnosis not present

## 2015-09-28 DIAGNOSIS — R5381 Other malaise: Secondary | ICD-10-CM | POA: Diagnosis not present

## 2015-09-28 DIAGNOSIS — R262 Difficulty in walking, not elsewhere classified: Secondary | ICD-10-CM | POA: Diagnosis not present

## 2015-09-28 DIAGNOSIS — Z96651 Presence of right artificial knee joint: Secondary | ICD-10-CM | POA: Diagnosis not present

## 2015-09-30 DIAGNOSIS — R5381 Other malaise: Secondary | ICD-10-CM | POA: Diagnosis not present

## 2015-09-30 DIAGNOSIS — R262 Difficulty in walking, not elsewhere classified: Secondary | ICD-10-CM | POA: Diagnosis not present

## 2015-10-03 DIAGNOSIS — L03115 Cellulitis of right lower limb: Secondary | ICD-10-CM | POA: Diagnosis not present

## 2015-10-03 DIAGNOSIS — M1711 Unilateral primary osteoarthritis, right knee: Secondary | ICD-10-CM | POA: Diagnosis not present

## 2015-10-03 DIAGNOSIS — R600 Localized edema: Secondary | ICD-10-CM | POA: Diagnosis not present

## 2015-10-06 DIAGNOSIS — R262 Difficulty in walking, not elsewhere classified: Secondary | ICD-10-CM | POA: Diagnosis not present

## 2015-10-06 DIAGNOSIS — R5381 Other malaise: Secondary | ICD-10-CM | POA: Diagnosis not present

## 2015-10-07 DIAGNOSIS — M25561 Pain in right knee: Secondary | ICD-10-CM | POA: Diagnosis not present

## 2015-10-08 DIAGNOSIS — M25561 Pain in right knee: Secondary | ICD-10-CM | POA: Diagnosis not present

## 2015-10-10 DIAGNOSIS — M1711 Unilateral primary osteoarthritis, right knee: Secondary | ICD-10-CM | POA: Diagnosis not present

## 2015-10-11 DIAGNOSIS — R262 Difficulty in walking, not elsewhere classified: Secondary | ICD-10-CM | POA: Diagnosis not present

## 2015-10-11 DIAGNOSIS — R5381 Other malaise: Secondary | ICD-10-CM | POA: Diagnosis not present

## 2015-10-21 DIAGNOSIS — Z471 Aftercare following joint replacement surgery: Secondary | ICD-10-CM | POA: Diagnosis not present

## 2015-10-21 DIAGNOSIS — D649 Anemia, unspecified: Secondary | ICD-10-CM | POA: Diagnosis not present

## 2015-10-21 DIAGNOSIS — J449 Chronic obstructive pulmonary disease, unspecified: Secondary | ICD-10-CM | POA: Diagnosis not present

## 2015-10-21 DIAGNOSIS — I259 Chronic ischemic heart disease, unspecified: Secondary | ICD-10-CM | POA: Diagnosis not present

## 2015-10-21 DIAGNOSIS — E039 Hypothyroidism, unspecified: Secondary | ICD-10-CM | POA: Diagnosis not present

## 2015-10-21 DIAGNOSIS — I129 Hypertensive chronic kidney disease with stage 1 through stage 4 chronic kidney disease, or unspecified chronic kidney disease: Secondary | ICD-10-CM | POA: Diagnosis not present

## 2015-10-21 DIAGNOSIS — R262 Difficulty in walking, not elsewhere classified: Secondary | ICD-10-CM | POA: Diagnosis not present

## 2015-10-21 DIAGNOSIS — M6281 Muscle weakness (generalized): Secondary | ICD-10-CM | POA: Diagnosis not present

## 2015-10-21 DIAGNOSIS — N183 Chronic kidney disease, stage 3 (moderate): Secondary | ICD-10-CM | POA: Diagnosis not present

## 2015-10-22 DIAGNOSIS — N183 Chronic kidney disease, stage 3 (moderate): Secondary | ICD-10-CM | POA: Diagnosis not present

## 2015-10-22 DIAGNOSIS — D649 Anemia, unspecified: Secondary | ICD-10-CM | POA: Diagnosis not present

## 2015-10-22 DIAGNOSIS — Z471 Aftercare following joint replacement surgery: Secondary | ICD-10-CM | POA: Diagnosis not present

## 2015-10-22 DIAGNOSIS — E039 Hypothyroidism, unspecified: Secondary | ICD-10-CM | POA: Diagnosis not present

## 2015-10-22 DIAGNOSIS — J449 Chronic obstructive pulmonary disease, unspecified: Secondary | ICD-10-CM | POA: Diagnosis not present

## 2015-10-22 DIAGNOSIS — I129 Hypertensive chronic kidney disease with stage 1 through stage 4 chronic kidney disease, or unspecified chronic kidney disease: Secondary | ICD-10-CM | POA: Diagnosis not present

## 2015-10-24 DIAGNOSIS — Z471 Aftercare following joint replacement surgery: Secondary | ICD-10-CM | POA: Diagnosis not present

## 2015-10-24 DIAGNOSIS — D649 Anemia, unspecified: Secondary | ICD-10-CM | POA: Diagnosis not present

## 2015-10-24 DIAGNOSIS — E039 Hypothyroidism, unspecified: Secondary | ICD-10-CM | POA: Diagnosis not present

## 2015-10-24 DIAGNOSIS — J449 Chronic obstructive pulmonary disease, unspecified: Secondary | ICD-10-CM | POA: Diagnosis not present

## 2015-10-24 DIAGNOSIS — I129 Hypertensive chronic kidney disease with stage 1 through stage 4 chronic kidney disease, or unspecified chronic kidney disease: Secondary | ICD-10-CM | POA: Diagnosis not present

## 2015-10-24 DIAGNOSIS — N183 Chronic kidney disease, stage 3 (moderate): Secondary | ICD-10-CM | POA: Diagnosis not present

## 2015-10-25 ENCOUNTER — Telehealth: Payer: Self-pay | Admitting: Internal Medicine

## 2015-10-25 DIAGNOSIS — Z471 Aftercare following joint replacement surgery: Secondary | ICD-10-CM | POA: Diagnosis not present

## 2015-10-25 DIAGNOSIS — I129 Hypertensive chronic kidney disease with stage 1 through stage 4 chronic kidney disease, or unspecified chronic kidney disease: Secondary | ICD-10-CM | POA: Diagnosis not present

## 2015-10-25 DIAGNOSIS — E039 Hypothyroidism, unspecified: Secondary | ICD-10-CM | POA: Diagnosis not present

## 2015-10-25 DIAGNOSIS — N183 Chronic kidney disease, stage 3 (moderate): Secondary | ICD-10-CM | POA: Diagnosis not present

## 2015-10-25 DIAGNOSIS — J449 Chronic obstructive pulmonary disease, unspecified: Secondary | ICD-10-CM | POA: Diagnosis not present

## 2015-10-25 DIAGNOSIS — D649 Anemia, unspecified: Secondary | ICD-10-CM | POA: Diagnosis not present

## 2015-10-25 NOTE — Telephone Encounter (Signed)
Taylor Giles was calling in to report a potential drug interaction between Metoprolol and Verapamil. She also wanted to make the doctor aware that she was taking Eliquis , it was discharged but now she is on Xarelto. Please call  Thanks

## 2015-10-25 NOTE — Telephone Encounter (Signed)
Will forward to  DR HILTY  FOR  REVIEW AND  RECOMMENDATIONS .Adonis Housekeeper

## 2015-10-26 DIAGNOSIS — E039 Hypothyroidism, unspecified: Secondary | ICD-10-CM | POA: Diagnosis not present

## 2015-10-26 DIAGNOSIS — J449 Chronic obstructive pulmonary disease, unspecified: Secondary | ICD-10-CM | POA: Diagnosis not present

## 2015-10-26 DIAGNOSIS — N183 Chronic kidney disease, stage 3 (moderate): Secondary | ICD-10-CM | POA: Diagnosis not present

## 2015-10-26 DIAGNOSIS — Z471 Aftercare following joint replacement surgery: Secondary | ICD-10-CM | POA: Diagnosis not present

## 2015-10-26 DIAGNOSIS — I129 Hypertensive chronic kidney disease with stage 1 through stage 4 chronic kidney disease, or unspecified chronic kidney disease: Secondary | ICD-10-CM | POA: Diagnosis not present

## 2015-10-26 DIAGNOSIS — D649 Anemia, unspecified: Secondary | ICD-10-CM | POA: Diagnosis not present

## 2015-10-26 NOTE — Telephone Encounter (Signed)
I am aware that they can cause bradycardia - I have reduced the dose of the metoprolol in the past and she was tolerating the combination. I do see that she was put on Xarelto at [facility] (via [place]) after knee surgery. Please add that to her Epic chart.  Dr. Debara Pickett

## 2015-10-27 DIAGNOSIS — Z471 Aftercare following joint replacement surgery: Secondary | ICD-10-CM | POA: Diagnosis not present

## 2015-10-27 DIAGNOSIS — I129 Hypertensive chronic kidney disease with stage 1 through stage 4 chronic kidney disease, or unspecified chronic kidney disease: Secondary | ICD-10-CM | POA: Diagnosis not present

## 2015-10-27 DIAGNOSIS — N183 Chronic kidney disease, stage 3 (moderate): Secondary | ICD-10-CM | POA: Diagnosis not present

## 2015-10-27 DIAGNOSIS — D649 Anemia, unspecified: Secondary | ICD-10-CM | POA: Diagnosis not present

## 2015-10-27 DIAGNOSIS — E039 Hypothyroidism, unspecified: Secondary | ICD-10-CM | POA: Diagnosis not present

## 2015-10-27 DIAGNOSIS — J449 Chronic obstructive pulmonary disease, unspecified: Secondary | ICD-10-CM | POA: Diagnosis not present

## 2015-10-28 DIAGNOSIS — D649 Anemia, unspecified: Secondary | ICD-10-CM | POA: Diagnosis not present

## 2015-10-28 DIAGNOSIS — J449 Chronic obstructive pulmonary disease, unspecified: Secondary | ICD-10-CM | POA: Diagnosis not present

## 2015-10-28 DIAGNOSIS — N183 Chronic kidney disease, stage 3 (moderate): Secondary | ICD-10-CM | POA: Diagnosis not present

## 2015-10-28 DIAGNOSIS — I129 Hypertensive chronic kidney disease with stage 1 through stage 4 chronic kidney disease, or unspecified chronic kidney disease: Secondary | ICD-10-CM | POA: Diagnosis not present

## 2015-10-28 DIAGNOSIS — E039 Hypothyroidism, unspecified: Secondary | ICD-10-CM | POA: Diagnosis not present

## 2015-10-28 DIAGNOSIS — Z471 Aftercare following joint replacement surgery: Secondary | ICD-10-CM | POA: Diagnosis not present

## 2015-10-28 NOTE — Telephone Encounter (Signed)
[place]-- left message to call back- to inform her of Dr [facility] reply  RN attempted to place XARELTO on patients medication list   per [facility] ( care everywhere) xarelto was discontinued on 10/07/15

## 2015-10-31 DIAGNOSIS — J449 Chronic obstructive pulmonary disease, unspecified: Secondary | ICD-10-CM | POA: Diagnosis not present

## 2015-10-31 DIAGNOSIS — I129 Hypertensive chronic kidney disease with stage 1 through stage 4 chronic kidney disease, or unspecified chronic kidney disease: Secondary | ICD-10-CM | POA: Diagnosis not present

## 2015-10-31 DIAGNOSIS — E039 Hypothyroidism, unspecified: Secondary | ICD-10-CM | POA: Diagnosis not present

## 2015-10-31 DIAGNOSIS — Z471 Aftercare following joint replacement surgery: Secondary | ICD-10-CM | POA: Diagnosis not present

## 2015-10-31 DIAGNOSIS — N183 Chronic kidney disease, stage 3 (moderate): Secondary | ICD-10-CM | POA: Diagnosis not present

## 2015-10-31 DIAGNOSIS — D649 Anemia, unspecified: Secondary | ICD-10-CM | POA: Diagnosis not present

## 2015-11-01 NOTE — Telephone Encounter (Signed)
Lm to CALL BACK./CY 

## 2015-11-02 DIAGNOSIS — E039 Hypothyroidism, unspecified: Secondary | ICD-10-CM | POA: Diagnosis not present

## 2015-11-02 DIAGNOSIS — Z471 Aftercare following joint replacement surgery: Secondary | ICD-10-CM | POA: Diagnosis not present

## 2015-11-02 DIAGNOSIS — D649 Anemia, unspecified: Secondary | ICD-10-CM | POA: Diagnosis not present

## 2015-11-02 DIAGNOSIS — N183 Chronic kidney disease, stage 3 (moderate): Secondary | ICD-10-CM | POA: Diagnosis not present

## 2015-11-02 DIAGNOSIS — I129 Hypertensive chronic kidney disease with stage 1 through stage 4 chronic kidney disease, or unspecified chronic kidney disease: Secondary | ICD-10-CM | POA: Diagnosis not present

## 2015-11-02 DIAGNOSIS — J449 Chronic obstructive pulmonary disease, unspecified: Secondary | ICD-10-CM | POA: Diagnosis not present

## 2015-11-03 DIAGNOSIS — E039 Hypothyroidism, unspecified: Secondary | ICD-10-CM | POA: Diagnosis not present

## 2015-11-03 DIAGNOSIS — Z471 Aftercare following joint replacement surgery: Secondary | ICD-10-CM | POA: Diagnosis not present

## 2015-11-03 DIAGNOSIS — I129 Hypertensive chronic kidney disease with stage 1 through stage 4 chronic kidney disease, or unspecified chronic kidney disease: Secondary | ICD-10-CM | POA: Diagnosis not present

## 2015-11-03 DIAGNOSIS — D649 Anemia, unspecified: Secondary | ICD-10-CM | POA: Diagnosis not present

## 2015-11-03 DIAGNOSIS — N183 Chronic kidney disease, stage 3 (moderate): Secondary | ICD-10-CM | POA: Diagnosis not present

## 2015-11-03 DIAGNOSIS — J449 Chronic obstructive pulmonary disease, unspecified: Secondary | ICD-10-CM | POA: Diagnosis not present

## 2015-11-05 DIAGNOSIS — I129 Hypertensive chronic kidney disease with stage 1 through stage 4 chronic kidney disease, or unspecified chronic kidney disease: Secondary | ICD-10-CM | POA: Diagnosis not present

## 2015-11-05 DIAGNOSIS — Z471 Aftercare following joint replacement surgery: Secondary | ICD-10-CM | POA: Diagnosis not present

## 2015-11-05 DIAGNOSIS — J449 Chronic obstructive pulmonary disease, unspecified: Secondary | ICD-10-CM | POA: Diagnosis not present

## 2015-11-05 DIAGNOSIS — N183 Chronic kidney disease, stage 3 (moderate): Secondary | ICD-10-CM | POA: Diagnosis not present

## 2015-11-05 DIAGNOSIS — E039 Hypothyroidism, unspecified: Secondary | ICD-10-CM | POA: Diagnosis not present

## 2015-11-05 DIAGNOSIS — D649 Anemia, unspecified: Secondary | ICD-10-CM | POA: Diagnosis not present

## 2015-11-07 DIAGNOSIS — E039 Hypothyroidism, unspecified: Secondary | ICD-10-CM | POA: Diagnosis not present

## 2015-11-07 DIAGNOSIS — N183 Chronic kidney disease, stage 3 (moderate): Secondary | ICD-10-CM | POA: Diagnosis not present

## 2015-11-07 DIAGNOSIS — D649 Anemia, unspecified: Secondary | ICD-10-CM | POA: Diagnosis not present

## 2015-11-07 DIAGNOSIS — I129 Hypertensive chronic kidney disease with stage 1 through stage 4 chronic kidney disease, or unspecified chronic kidney disease: Secondary | ICD-10-CM | POA: Diagnosis not present

## 2015-11-07 DIAGNOSIS — Z471 Aftercare following joint replacement surgery: Secondary | ICD-10-CM | POA: Diagnosis not present

## 2015-11-07 DIAGNOSIS — J449 Chronic obstructive pulmonary disease, unspecified: Secondary | ICD-10-CM | POA: Diagnosis not present

## 2015-11-08 DIAGNOSIS — I129 Hypertensive chronic kidney disease with stage 1 through stage 4 chronic kidney disease, or unspecified chronic kidney disease: Secondary | ICD-10-CM | POA: Diagnosis not present

## 2015-11-08 DIAGNOSIS — D649 Anemia, unspecified: Secondary | ICD-10-CM | POA: Diagnosis not present

## 2015-11-08 DIAGNOSIS — N183 Chronic kidney disease, stage 3 (moderate): Secondary | ICD-10-CM | POA: Diagnosis not present

## 2015-11-08 DIAGNOSIS — E039 Hypothyroidism, unspecified: Secondary | ICD-10-CM | POA: Diagnosis not present

## 2015-11-08 DIAGNOSIS — J449 Chronic obstructive pulmonary disease, unspecified: Secondary | ICD-10-CM | POA: Diagnosis not present

## 2015-11-08 DIAGNOSIS — Z471 Aftercare following joint replacement surgery: Secondary | ICD-10-CM | POA: Diagnosis not present

## 2015-11-09 DIAGNOSIS — D649 Anemia, unspecified: Secondary | ICD-10-CM | POA: Diagnosis not present

## 2015-11-09 DIAGNOSIS — I129 Hypertensive chronic kidney disease with stage 1 through stage 4 chronic kidney disease, or unspecified chronic kidney disease: Secondary | ICD-10-CM | POA: Diagnosis not present

## 2015-11-09 DIAGNOSIS — Z471 Aftercare following joint replacement surgery: Secondary | ICD-10-CM | POA: Diagnosis not present

## 2015-11-09 DIAGNOSIS — J449 Chronic obstructive pulmonary disease, unspecified: Secondary | ICD-10-CM | POA: Diagnosis not present

## 2015-11-09 DIAGNOSIS — E039 Hypothyroidism, unspecified: Secondary | ICD-10-CM | POA: Diagnosis not present

## 2015-11-09 DIAGNOSIS — N183 Chronic kidney disease, stage 3 (moderate): Secondary | ICD-10-CM | POA: Diagnosis not present

## 2015-11-10 DIAGNOSIS — J449 Chronic obstructive pulmonary disease, unspecified: Secondary | ICD-10-CM | POA: Diagnosis not present

## 2015-11-10 DIAGNOSIS — I129 Hypertensive chronic kidney disease with stage 1 through stage 4 chronic kidney disease, or unspecified chronic kidney disease: Secondary | ICD-10-CM | POA: Diagnosis not present

## 2015-11-10 DIAGNOSIS — E039 Hypothyroidism, unspecified: Secondary | ICD-10-CM | POA: Diagnosis not present

## 2015-11-10 DIAGNOSIS — D649 Anemia, unspecified: Secondary | ICD-10-CM | POA: Diagnosis not present

## 2015-11-10 DIAGNOSIS — Z471 Aftercare following joint replacement surgery: Secondary | ICD-10-CM | POA: Diagnosis not present

## 2015-11-10 DIAGNOSIS — N183 Chronic kidney disease, stage 3 (moderate): Secondary | ICD-10-CM | POA: Diagnosis not present

## 2015-11-11 DIAGNOSIS — I129 Hypertensive chronic kidney disease with stage 1 through stage 4 chronic kidney disease, or unspecified chronic kidney disease: Secondary | ICD-10-CM | POA: Diagnosis not present

## 2015-11-11 DIAGNOSIS — E039 Hypothyroidism, unspecified: Secondary | ICD-10-CM | POA: Diagnosis not present

## 2015-11-11 DIAGNOSIS — Z471 Aftercare following joint replacement surgery: Secondary | ICD-10-CM | POA: Diagnosis not present

## 2015-11-11 DIAGNOSIS — D649 Anemia, unspecified: Secondary | ICD-10-CM | POA: Diagnosis not present

## 2015-11-11 DIAGNOSIS — J449 Chronic obstructive pulmonary disease, unspecified: Secondary | ICD-10-CM | POA: Diagnosis not present

## 2015-11-11 DIAGNOSIS — N183 Chronic kidney disease, stage 3 (moderate): Secondary | ICD-10-CM | POA: Diagnosis not present

## 2015-11-14 DIAGNOSIS — J449 Chronic obstructive pulmonary disease, unspecified: Secondary | ICD-10-CM | POA: Diagnosis not present

## 2015-11-14 DIAGNOSIS — Z471 Aftercare following joint replacement surgery: Secondary | ICD-10-CM | POA: Diagnosis not present

## 2015-11-14 DIAGNOSIS — N183 Chronic kidney disease, stage 3 (moderate): Secondary | ICD-10-CM | POA: Diagnosis not present

## 2015-11-14 DIAGNOSIS — D649 Anemia, unspecified: Secondary | ICD-10-CM | POA: Diagnosis not present

## 2015-11-14 DIAGNOSIS — E039 Hypothyroidism, unspecified: Secondary | ICD-10-CM | POA: Diagnosis not present

## 2015-11-14 DIAGNOSIS — I129 Hypertensive chronic kidney disease with stage 1 through stage 4 chronic kidney disease, or unspecified chronic kidney disease: Secondary | ICD-10-CM | POA: Diagnosis not present

## 2015-11-17 DIAGNOSIS — Z96651 Presence of right artificial knee joint: Secondary | ICD-10-CM | POA: Diagnosis not present

## 2015-11-17 DIAGNOSIS — M25572 Pain in left ankle and joints of left foot: Secondary | ICD-10-CM | POA: Diagnosis not present

## 2015-11-17 DIAGNOSIS — I129 Hypertensive chronic kidney disease with stage 1 through stage 4 chronic kidney disease, or unspecified chronic kidney disease: Secondary | ICD-10-CM | POA: Diagnosis not present

## 2015-11-17 DIAGNOSIS — M19072 Primary osteoarthritis, left ankle and foot: Secondary | ICD-10-CM | POA: Diagnosis not present

## 2015-11-17 DIAGNOSIS — E039 Hypothyroidism, unspecified: Secondary | ICD-10-CM | POA: Diagnosis not present

## 2015-11-17 DIAGNOSIS — D649 Anemia, unspecified: Secondary | ICD-10-CM | POA: Diagnosis not present

## 2015-11-17 DIAGNOSIS — N183 Chronic kidney disease, stage 3 (moderate): Secondary | ICD-10-CM | POA: Diagnosis not present

## 2015-11-17 DIAGNOSIS — M1711 Unilateral primary osteoarthritis, right knee: Secondary | ICD-10-CM | POA: Diagnosis not present

## 2015-11-17 DIAGNOSIS — Z471 Aftercare following joint replacement surgery: Secondary | ICD-10-CM | POA: Diagnosis not present

## 2015-11-17 DIAGNOSIS — J449 Chronic obstructive pulmonary disease, unspecified: Secondary | ICD-10-CM | POA: Diagnosis not present

## 2015-11-18 DIAGNOSIS — Z471 Aftercare following joint replacement surgery: Secondary | ICD-10-CM | POA: Diagnosis not present

## 2015-11-18 DIAGNOSIS — N183 Chronic kidney disease, stage 3 (moderate): Secondary | ICD-10-CM | POA: Diagnosis not present

## 2015-11-18 DIAGNOSIS — D649 Anemia, unspecified: Secondary | ICD-10-CM | POA: Diagnosis not present

## 2015-11-18 DIAGNOSIS — J449 Chronic obstructive pulmonary disease, unspecified: Secondary | ICD-10-CM | POA: Diagnosis not present

## 2015-11-18 DIAGNOSIS — I129 Hypertensive chronic kidney disease with stage 1 through stage 4 chronic kidney disease, or unspecified chronic kidney disease: Secondary | ICD-10-CM | POA: Diagnosis not present

## 2015-11-18 DIAGNOSIS — E039 Hypothyroidism, unspecified: Secondary | ICD-10-CM | POA: Diagnosis not present

## 2015-11-22 DIAGNOSIS — J449 Chronic obstructive pulmonary disease, unspecified: Secondary | ICD-10-CM | POA: Diagnosis not present

## 2015-11-22 DIAGNOSIS — D649 Anemia, unspecified: Secondary | ICD-10-CM | POA: Diagnosis not present

## 2015-11-22 DIAGNOSIS — Z471 Aftercare following joint replacement surgery: Secondary | ICD-10-CM | POA: Diagnosis not present

## 2015-11-22 DIAGNOSIS — E039 Hypothyroidism, unspecified: Secondary | ICD-10-CM | POA: Diagnosis not present

## 2015-11-22 DIAGNOSIS — N183 Chronic kidney disease, stage 3 (moderate): Secondary | ICD-10-CM | POA: Diagnosis not present

## 2015-11-22 DIAGNOSIS — I129 Hypertensive chronic kidney disease with stage 1 through stage 4 chronic kidney disease, or unspecified chronic kidney disease: Secondary | ICD-10-CM | POA: Diagnosis not present

## 2015-11-23 DIAGNOSIS — N183 Chronic kidney disease, stage 3 (moderate): Secondary | ICD-10-CM | POA: Diagnosis not present

## 2015-11-23 DIAGNOSIS — J449 Chronic obstructive pulmonary disease, unspecified: Secondary | ICD-10-CM | POA: Diagnosis not present

## 2015-11-23 DIAGNOSIS — E039 Hypothyroidism, unspecified: Secondary | ICD-10-CM | POA: Diagnosis not present

## 2015-11-23 DIAGNOSIS — I129 Hypertensive chronic kidney disease with stage 1 through stage 4 chronic kidney disease, or unspecified chronic kidney disease: Secondary | ICD-10-CM | POA: Diagnosis not present

## 2015-11-23 DIAGNOSIS — Z471 Aftercare following joint replacement surgery: Secondary | ICD-10-CM | POA: Diagnosis not present

## 2015-11-23 DIAGNOSIS — D649 Anemia, unspecified: Secondary | ICD-10-CM | POA: Diagnosis not present

## 2015-11-25 DIAGNOSIS — D649 Anemia, unspecified: Secondary | ICD-10-CM | POA: Diagnosis not present

## 2015-11-25 DIAGNOSIS — Z471 Aftercare following joint replacement surgery: Secondary | ICD-10-CM | POA: Diagnosis not present

## 2015-11-25 DIAGNOSIS — J449 Chronic obstructive pulmonary disease, unspecified: Secondary | ICD-10-CM | POA: Diagnosis not present

## 2015-11-25 DIAGNOSIS — N183 Chronic kidney disease, stage 3 (moderate): Secondary | ICD-10-CM | POA: Diagnosis not present

## 2015-11-25 DIAGNOSIS — E039 Hypothyroidism, unspecified: Secondary | ICD-10-CM | POA: Diagnosis not present

## 2015-11-25 DIAGNOSIS — I129 Hypertensive chronic kidney disease with stage 1 through stage 4 chronic kidney disease, or unspecified chronic kidney disease: Secondary | ICD-10-CM | POA: Diagnosis not present

## 2015-11-28 DIAGNOSIS — E039 Hypothyroidism, unspecified: Secondary | ICD-10-CM | POA: Diagnosis not present

## 2015-11-28 DIAGNOSIS — Z471 Aftercare following joint replacement surgery: Secondary | ICD-10-CM | POA: Diagnosis not present

## 2015-11-28 DIAGNOSIS — I129 Hypertensive chronic kidney disease with stage 1 through stage 4 chronic kidney disease, or unspecified chronic kidney disease: Secondary | ICD-10-CM | POA: Diagnosis not present

## 2015-11-28 DIAGNOSIS — D649 Anemia, unspecified: Secondary | ICD-10-CM | POA: Diagnosis not present

## 2015-11-28 DIAGNOSIS — N183 Chronic kidney disease, stage 3 (moderate): Secondary | ICD-10-CM | POA: Diagnosis not present

## 2015-11-28 DIAGNOSIS — J449 Chronic obstructive pulmonary disease, unspecified: Secondary | ICD-10-CM | POA: Diagnosis not present

## 2015-12-01 DIAGNOSIS — D649 Anemia, unspecified: Secondary | ICD-10-CM | POA: Diagnosis not present

## 2015-12-01 DIAGNOSIS — I129 Hypertensive chronic kidney disease with stage 1 through stage 4 chronic kidney disease, or unspecified chronic kidney disease: Secondary | ICD-10-CM | POA: Diagnosis not present

## 2015-12-01 DIAGNOSIS — E039 Hypothyroidism, unspecified: Secondary | ICD-10-CM | POA: Diagnosis not present

## 2015-12-01 DIAGNOSIS — J449 Chronic obstructive pulmonary disease, unspecified: Secondary | ICD-10-CM | POA: Diagnosis not present

## 2015-12-01 DIAGNOSIS — N183 Chronic kidney disease, stage 3 (moderate): Secondary | ICD-10-CM | POA: Diagnosis not present

## 2015-12-01 DIAGNOSIS — Z471 Aftercare following joint replacement surgery: Secondary | ICD-10-CM | POA: Diagnosis not present

## 2015-12-06 DIAGNOSIS — D649 Anemia, unspecified: Secondary | ICD-10-CM | POA: Diagnosis not present

## 2015-12-06 DIAGNOSIS — J449 Chronic obstructive pulmonary disease, unspecified: Secondary | ICD-10-CM | POA: Diagnosis not present

## 2015-12-06 DIAGNOSIS — N183 Chronic kidney disease, stage 3 (moderate): Secondary | ICD-10-CM | POA: Diagnosis not present

## 2015-12-06 DIAGNOSIS — Z471 Aftercare following joint replacement surgery: Secondary | ICD-10-CM | POA: Diagnosis not present

## 2015-12-06 DIAGNOSIS — I129 Hypertensive chronic kidney disease with stage 1 through stage 4 chronic kidney disease, or unspecified chronic kidney disease: Secondary | ICD-10-CM | POA: Diagnosis not present

## 2015-12-06 DIAGNOSIS — E039 Hypothyroidism, unspecified: Secondary | ICD-10-CM | POA: Diagnosis not present

## 2015-12-08 DIAGNOSIS — I129 Hypertensive chronic kidney disease with stage 1 through stage 4 chronic kidney disease, or unspecified chronic kidney disease: Secondary | ICD-10-CM | POA: Diagnosis not present

## 2015-12-08 DIAGNOSIS — E039 Hypothyroidism, unspecified: Secondary | ICD-10-CM | POA: Diagnosis not present

## 2015-12-08 DIAGNOSIS — N183 Chronic kidney disease, stage 3 (moderate): Secondary | ICD-10-CM | POA: Diagnosis not present

## 2015-12-08 DIAGNOSIS — J449 Chronic obstructive pulmonary disease, unspecified: Secondary | ICD-10-CM | POA: Diagnosis not present

## 2015-12-08 DIAGNOSIS — D649 Anemia, unspecified: Secondary | ICD-10-CM | POA: Diagnosis not present

## 2015-12-08 DIAGNOSIS — Z471 Aftercare following joint replacement surgery: Secondary | ICD-10-CM | POA: Diagnosis not present

## 2015-12-12 DIAGNOSIS — E039 Hypothyroidism, unspecified: Secondary | ICD-10-CM | POA: Diagnosis not present

## 2015-12-12 DIAGNOSIS — I129 Hypertensive chronic kidney disease with stage 1 through stage 4 chronic kidney disease, or unspecified chronic kidney disease: Secondary | ICD-10-CM | POA: Diagnosis not present

## 2015-12-12 DIAGNOSIS — J449 Chronic obstructive pulmonary disease, unspecified: Secondary | ICD-10-CM | POA: Diagnosis not present

## 2015-12-12 DIAGNOSIS — Z471 Aftercare following joint replacement surgery: Secondary | ICD-10-CM | POA: Diagnosis not present

## 2015-12-12 DIAGNOSIS — N183 Chronic kidney disease, stage 3 (moderate): Secondary | ICD-10-CM | POA: Diagnosis not present

## 2015-12-12 DIAGNOSIS — D649 Anemia, unspecified: Secondary | ICD-10-CM | POA: Diagnosis not present

## 2015-12-14 DIAGNOSIS — N183 Chronic kidney disease, stage 3 (moderate): Secondary | ICD-10-CM | POA: Diagnosis not present

## 2015-12-14 DIAGNOSIS — Z471 Aftercare following joint replacement surgery: Secondary | ICD-10-CM | POA: Diagnosis not present

## 2015-12-14 DIAGNOSIS — I129 Hypertensive chronic kidney disease with stage 1 through stage 4 chronic kidney disease, or unspecified chronic kidney disease: Secondary | ICD-10-CM | POA: Diagnosis not present

## 2015-12-14 DIAGNOSIS — D649 Anemia, unspecified: Secondary | ICD-10-CM | POA: Diagnosis not present

## 2015-12-14 DIAGNOSIS — E039 Hypothyroidism, unspecified: Secondary | ICD-10-CM | POA: Diagnosis not present

## 2015-12-14 DIAGNOSIS — J449 Chronic obstructive pulmonary disease, unspecified: Secondary | ICD-10-CM | POA: Diagnosis not present

## 2015-12-15 DIAGNOSIS — Z471 Aftercare following joint replacement surgery: Secondary | ICD-10-CM | POA: Diagnosis not present

## 2015-12-15 DIAGNOSIS — L859 Epidermal thickening, unspecified: Secondary | ICD-10-CM | POA: Diagnosis not present

## 2015-12-15 DIAGNOSIS — I739 Peripheral vascular disease, unspecified: Secondary | ICD-10-CM | POA: Diagnosis not present

## 2015-12-15 DIAGNOSIS — D649 Anemia, unspecified: Secondary | ICD-10-CM | POA: Diagnosis not present

## 2015-12-15 DIAGNOSIS — E039 Hypothyroidism, unspecified: Secondary | ICD-10-CM | POA: Diagnosis not present

## 2015-12-15 DIAGNOSIS — J449 Chronic obstructive pulmonary disease, unspecified: Secondary | ICD-10-CM | POA: Diagnosis not present

## 2015-12-15 DIAGNOSIS — I129 Hypertensive chronic kidney disease with stage 1 through stage 4 chronic kidney disease, or unspecified chronic kidney disease: Secondary | ICD-10-CM | POA: Diagnosis not present

## 2015-12-15 DIAGNOSIS — B351 Tinea unguium: Secondary | ICD-10-CM | POA: Diagnosis not present

## 2015-12-15 DIAGNOSIS — N183 Chronic kidney disease, stage 3 (moderate): Secondary | ICD-10-CM | POA: Diagnosis not present

## 2015-12-20 DIAGNOSIS — M6281 Muscle weakness (generalized): Secondary | ICD-10-CM | POA: Diagnosis not present

## 2015-12-20 DIAGNOSIS — N183 Chronic kidney disease, stage 3 (moderate): Secondary | ICD-10-CM | POA: Diagnosis not present

## 2015-12-20 DIAGNOSIS — R262 Difficulty in walking, not elsewhere classified: Secondary | ICD-10-CM | POA: Diagnosis not present

## 2015-12-20 DIAGNOSIS — I129 Hypertensive chronic kidney disease with stage 1 through stage 4 chronic kidney disease, or unspecified chronic kidney disease: Secondary | ICD-10-CM | POA: Diagnosis not present

## 2015-12-20 DIAGNOSIS — J449 Chronic obstructive pulmonary disease, unspecified: Secondary | ICD-10-CM | POA: Diagnosis not present

## 2015-12-20 DIAGNOSIS — I259 Chronic ischemic heart disease, unspecified: Secondary | ICD-10-CM | POA: Diagnosis not present

## 2015-12-21 DIAGNOSIS — N183 Chronic kidney disease, stage 3 (moderate): Secondary | ICD-10-CM | POA: Diagnosis not present

## 2015-12-21 DIAGNOSIS — I259 Chronic ischemic heart disease, unspecified: Secondary | ICD-10-CM | POA: Diagnosis not present

## 2015-12-21 DIAGNOSIS — R262 Difficulty in walking, not elsewhere classified: Secondary | ICD-10-CM | POA: Diagnosis not present

## 2015-12-21 DIAGNOSIS — I129 Hypertensive chronic kidney disease with stage 1 through stage 4 chronic kidney disease, or unspecified chronic kidney disease: Secondary | ICD-10-CM | POA: Diagnosis not present

## 2015-12-21 DIAGNOSIS — J449 Chronic obstructive pulmonary disease, unspecified: Secondary | ICD-10-CM | POA: Diagnosis not present

## 2015-12-21 DIAGNOSIS — M6281 Muscle weakness (generalized): Secondary | ICD-10-CM | POA: Diagnosis not present

## 2015-12-22 DIAGNOSIS — J449 Chronic obstructive pulmonary disease, unspecified: Secondary | ICD-10-CM | POA: Diagnosis not present

## 2015-12-22 DIAGNOSIS — I129 Hypertensive chronic kidney disease with stage 1 through stage 4 chronic kidney disease, or unspecified chronic kidney disease: Secondary | ICD-10-CM | POA: Diagnosis not present

## 2015-12-22 DIAGNOSIS — I259 Chronic ischemic heart disease, unspecified: Secondary | ICD-10-CM | POA: Diagnosis not present

## 2015-12-22 DIAGNOSIS — N183 Chronic kidney disease, stage 3 (moderate): Secondary | ICD-10-CM | POA: Diagnosis not present

## 2015-12-22 DIAGNOSIS — R262 Difficulty in walking, not elsewhere classified: Secondary | ICD-10-CM | POA: Diagnosis not present

## 2015-12-22 DIAGNOSIS — M6281 Muscle weakness (generalized): Secondary | ICD-10-CM | POA: Diagnosis not present

## 2015-12-27 DIAGNOSIS — I259 Chronic ischemic heart disease, unspecified: Secondary | ICD-10-CM | POA: Diagnosis not present

## 2015-12-27 DIAGNOSIS — J449 Chronic obstructive pulmonary disease, unspecified: Secondary | ICD-10-CM | POA: Diagnosis not present

## 2015-12-27 DIAGNOSIS — M6281 Muscle weakness (generalized): Secondary | ICD-10-CM | POA: Diagnosis not present

## 2015-12-27 DIAGNOSIS — R262 Difficulty in walking, not elsewhere classified: Secondary | ICD-10-CM | POA: Diagnosis not present

## 2015-12-27 DIAGNOSIS — I129 Hypertensive chronic kidney disease with stage 1 through stage 4 chronic kidney disease, or unspecified chronic kidney disease: Secondary | ICD-10-CM | POA: Diagnosis not present

## 2015-12-27 DIAGNOSIS — N183 Chronic kidney disease, stage 3 (moderate): Secondary | ICD-10-CM | POA: Diagnosis not present

## 2015-12-28 DIAGNOSIS — H900 Conductive hearing loss, bilateral: Secondary | ICD-10-CM | POA: Diagnosis not present

## 2015-12-28 DIAGNOSIS — H6123 Impacted cerumen, bilateral: Secondary | ICD-10-CM | POA: Diagnosis not present

## 2015-12-29 DIAGNOSIS — R262 Difficulty in walking, not elsewhere classified: Secondary | ICD-10-CM | POA: Diagnosis not present

## 2015-12-29 DIAGNOSIS — M6281 Muscle weakness (generalized): Secondary | ICD-10-CM | POA: Diagnosis not present

## 2015-12-29 DIAGNOSIS — I129 Hypertensive chronic kidney disease with stage 1 through stage 4 chronic kidney disease, or unspecified chronic kidney disease: Secondary | ICD-10-CM | POA: Diagnosis not present

## 2015-12-29 DIAGNOSIS — N183 Chronic kidney disease, stage 3 (moderate): Secondary | ICD-10-CM | POA: Diagnosis not present

## 2015-12-29 DIAGNOSIS — J449 Chronic obstructive pulmonary disease, unspecified: Secondary | ICD-10-CM | POA: Diagnosis not present

## 2015-12-29 DIAGNOSIS — I259 Chronic ischemic heart disease, unspecified: Secondary | ICD-10-CM | POA: Diagnosis not present

## 2016-01-03 DIAGNOSIS — I129 Hypertensive chronic kidney disease with stage 1 through stage 4 chronic kidney disease, or unspecified chronic kidney disease: Secondary | ICD-10-CM | POA: Diagnosis not present

## 2016-01-03 DIAGNOSIS — R262 Difficulty in walking, not elsewhere classified: Secondary | ICD-10-CM | POA: Diagnosis not present

## 2016-01-03 DIAGNOSIS — J449 Chronic obstructive pulmonary disease, unspecified: Secondary | ICD-10-CM | POA: Diagnosis not present

## 2016-01-03 DIAGNOSIS — N183 Chronic kidney disease, stage 3 (moderate): Secondary | ICD-10-CM | POA: Diagnosis not present

## 2016-01-03 DIAGNOSIS — I259 Chronic ischemic heart disease, unspecified: Secondary | ICD-10-CM | POA: Diagnosis not present

## 2016-01-03 DIAGNOSIS — M6281 Muscle weakness (generalized): Secondary | ICD-10-CM | POA: Diagnosis not present

## 2016-01-04 DIAGNOSIS — I129 Hypertensive chronic kidney disease with stage 1 through stage 4 chronic kidney disease, or unspecified chronic kidney disease: Secondary | ICD-10-CM | POA: Diagnosis not present

## 2016-01-04 DIAGNOSIS — M6281 Muscle weakness (generalized): Secondary | ICD-10-CM | POA: Diagnosis not present

## 2016-01-04 DIAGNOSIS — I259 Chronic ischemic heart disease, unspecified: Secondary | ICD-10-CM | POA: Diagnosis not present

## 2016-01-04 DIAGNOSIS — J449 Chronic obstructive pulmonary disease, unspecified: Secondary | ICD-10-CM | POA: Diagnosis not present

## 2016-01-04 DIAGNOSIS — R262 Difficulty in walking, not elsewhere classified: Secondary | ICD-10-CM | POA: Diagnosis not present

## 2016-01-04 DIAGNOSIS — N183 Chronic kidney disease, stage 3 (moderate): Secondary | ICD-10-CM | POA: Diagnosis not present

## 2016-01-05 DIAGNOSIS — M6281 Muscle weakness (generalized): Secondary | ICD-10-CM | POA: Diagnosis not present

## 2016-01-05 DIAGNOSIS — J449 Chronic obstructive pulmonary disease, unspecified: Secondary | ICD-10-CM | POA: Diagnosis not present

## 2016-01-05 DIAGNOSIS — N183 Chronic kidney disease, stage 3 (moderate): Secondary | ICD-10-CM | POA: Diagnosis not present

## 2016-01-05 DIAGNOSIS — I129 Hypertensive chronic kidney disease with stage 1 through stage 4 chronic kidney disease, or unspecified chronic kidney disease: Secondary | ICD-10-CM | POA: Diagnosis not present

## 2016-01-05 DIAGNOSIS — R262 Difficulty in walking, not elsewhere classified: Secondary | ICD-10-CM | POA: Diagnosis not present

## 2016-01-05 DIAGNOSIS — I259 Chronic ischemic heart disease, unspecified: Secondary | ICD-10-CM | POA: Diagnosis not present

## 2016-01-06 DIAGNOSIS — N183 Chronic kidney disease, stage 3 (moderate): Secondary | ICD-10-CM | POA: Diagnosis not present

## 2016-01-06 DIAGNOSIS — I259 Chronic ischemic heart disease, unspecified: Secondary | ICD-10-CM | POA: Diagnosis not present

## 2016-01-06 DIAGNOSIS — R262 Difficulty in walking, not elsewhere classified: Secondary | ICD-10-CM | POA: Diagnosis not present

## 2016-01-06 DIAGNOSIS — I129 Hypertensive chronic kidney disease with stage 1 through stage 4 chronic kidney disease, or unspecified chronic kidney disease: Secondary | ICD-10-CM | POA: Diagnosis not present

## 2016-01-06 DIAGNOSIS — J449 Chronic obstructive pulmonary disease, unspecified: Secondary | ICD-10-CM | POA: Diagnosis not present

## 2016-01-06 DIAGNOSIS — M6281 Muscle weakness (generalized): Secondary | ICD-10-CM | POA: Diagnosis not present

## 2016-01-09 DIAGNOSIS — I259 Chronic ischemic heart disease, unspecified: Secondary | ICD-10-CM | POA: Diagnosis not present

## 2016-01-09 DIAGNOSIS — R262 Difficulty in walking, not elsewhere classified: Secondary | ICD-10-CM | POA: Diagnosis not present

## 2016-01-09 DIAGNOSIS — J449 Chronic obstructive pulmonary disease, unspecified: Secondary | ICD-10-CM | POA: Diagnosis not present

## 2016-01-09 DIAGNOSIS — N183 Chronic kidney disease, stage 3 (moderate): Secondary | ICD-10-CM | POA: Diagnosis not present

## 2016-01-09 DIAGNOSIS — M6281 Muscle weakness (generalized): Secondary | ICD-10-CM | POA: Diagnosis not present

## 2016-01-09 DIAGNOSIS — I129 Hypertensive chronic kidney disease with stage 1 through stage 4 chronic kidney disease, or unspecified chronic kidney disease: Secondary | ICD-10-CM | POA: Diagnosis not present

## 2016-01-11 DIAGNOSIS — I129 Hypertensive chronic kidney disease with stage 1 through stage 4 chronic kidney disease, or unspecified chronic kidney disease: Secondary | ICD-10-CM | POA: Diagnosis not present

## 2016-01-11 DIAGNOSIS — N183 Chronic kidney disease, stage 3 (moderate): Secondary | ICD-10-CM | POA: Diagnosis not present

## 2016-01-11 DIAGNOSIS — J449 Chronic obstructive pulmonary disease, unspecified: Secondary | ICD-10-CM | POA: Diagnosis not present

## 2016-01-11 DIAGNOSIS — M6281 Muscle weakness (generalized): Secondary | ICD-10-CM | POA: Diagnosis not present

## 2016-01-11 DIAGNOSIS — I259 Chronic ischemic heart disease, unspecified: Secondary | ICD-10-CM | POA: Diagnosis not present

## 2016-01-11 DIAGNOSIS — R262 Difficulty in walking, not elsewhere classified: Secondary | ICD-10-CM | POA: Diagnosis not present

## 2016-01-16 DIAGNOSIS — Z96651 Presence of right artificial knee joint: Secondary | ICD-10-CM | POA: Diagnosis not present

## 2016-01-16 DIAGNOSIS — M19072 Primary osteoarthritis, left ankle and foot: Secondary | ICD-10-CM | POA: Diagnosis not present

## 2016-01-26 DIAGNOSIS — Z79899 Other long term (current) drug therapy: Secondary | ICD-10-CM | POA: Diagnosis not present

## 2016-01-26 DIAGNOSIS — N183 Chronic kidney disease, stage 3 (moderate): Secondary | ICD-10-CM | POA: Diagnosis not present

## 2016-01-26 DIAGNOSIS — I10 Essential (primary) hypertension: Secondary | ICD-10-CM | POA: Diagnosis not present

## 2016-01-26 DIAGNOSIS — R809 Proteinuria, unspecified: Secondary | ICD-10-CM | POA: Diagnosis not present

## 2016-01-26 DIAGNOSIS — E559 Vitamin D deficiency, unspecified: Secondary | ICD-10-CM | POA: Diagnosis not present

## 2016-01-26 DIAGNOSIS — D509 Iron deficiency anemia, unspecified: Secondary | ICD-10-CM | POA: Diagnosis not present

## 2016-01-31 DIAGNOSIS — M25572 Pain in left ankle and joints of left foot: Secondary | ICD-10-CM | POA: Diagnosis not present

## 2016-01-31 DIAGNOSIS — M19072 Primary osteoarthritis, left ankle and foot: Secondary | ICD-10-CM | POA: Diagnosis not present

## 2016-01-31 DIAGNOSIS — M216X2 Other acquired deformities of left foot: Secondary | ICD-10-CM | POA: Diagnosis not present

## 2016-02-21 DIAGNOSIS — M21072 Valgus deformity, not elsewhere classified, left ankle: Secondary | ICD-10-CM | POA: Diagnosis not present

## 2016-02-21 DIAGNOSIS — M19072 Primary osteoarthritis, left ankle and foot: Secondary | ICD-10-CM | POA: Diagnosis not present

## 2016-03-05 DIAGNOSIS — I739 Peripheral vascular disease, unspecified: Secondary | ICD-10-CM | POA: Diagnosis not present

## 2016-03-14 DIAGNOSIS — M25572 Pain in left ankle and joints of left foot: Secondary | ICD-10-CM | POA: Diagnosis not present

## 2016-03-14 DIAGNOSIS — I10 Essential (primary) hypertension: Secondary | ICD-10-CM | POA: Diagnosis not present

## 2016-03-14 DIAGNOSIS — Z6823 Body mass index (BMI) 23.0-23.9, adult: Secondary | ICD-10-CM | POA: Diagnosis not present

## 2016-03-14 DIAGNOSIS — Z1389 Encounter for screening for other disorder: Secondary | ICD-10-CM | POA: Diagnosis not present

## 2016-03-14 DIAGNOSIS — R7309 Other abnormal glucose: Secondary | ICD-10-CM | POA: Diagnosis not present

## 2016-03-14 DIAGNOSIS — E039 Hypothyroidism, unspecified: Secondary | ICD-10-CM | POA: Diagnosis not present

## 2016-03-14 DIAGNOSIS — E782 Mixed hyperlipidemia: Secondary | ICD-10-CM | POA: Diagnosis not present

## 2016-03-22 DIAGNOSIS — H348312 Tributary (branch) retinal vein occlusion, right eye, stable: Secondary | ICD-10-CM | POA: Diagnosis not present

## 2016-03-22 DIAGNOSIS — H353131 Nonexudative age-related macular degeneration, bilateral, early dry stage: Secondary | ICD-10-CM | POA: Diagnosis not present

## 2016-03-22 DIAGNOSIS — H35362 Drusen (degenerative) of macula, left eye: Secondary | ICD-10-CM | POA: Diagnosis not present

## 2016-03-22 DIAGNOSIS — H35361 Drusen (degenerative) of macula, right eye: Secondary | ICD-10-CM | POA: Diagnosis not present

## 2016-03-29 DIAGNOSIS — E039 Hypothyroidism, unspecified: Secondary | ICD-10-CM | POA: Diagnosis not present

## 2016-03-29 DIAGNOSIS — Z0000 Encounter for general adult medical examination without abnormal findings: Secondary | ICD-10-CM | POA: Diagnosis not present

## 2016-03-29 DIAGNOSIS — I739 Peripheral vascular disease, unspecified: Secondary | ICD-10-CM | POA: Diagnosis not present

## 2016-03-29 DIAGNOSIS — I129 Hypertensive chronic kidney disease with stage 1 through stage 4 chronic kidney disease, or unspecified chronic kidney disease: Secondary | ICD-10-CM | POA: Diagnosis not present

## 2016-04-02 DIAGNOSIS — M25579 Pain in unspecified ankle and joints of unspecified foot: Secondary | ICD-10-CM | POA: Diagnosis not present

## 2016-04-02 DIAGNOSIS — M79671 Pain in right foot: Secondary | ICD-10-CM | POA: Diagnosis not present

## 2016-04-02 DIAGNOSIS — M722 Plantar fascial fibromatosis: Secondary | ICD-10-CM | POA: Diagnosis not present

## 2016-04-02 DIAGNOSIS — M79672 Pain in left foot: Secondary | ICD-10-CM | POA: Diagnosis not present

## 2016-05-16 DIAGNOSIS — I739 Peripheral vascular disease, unspecified: Secondary | ICD-10-CM | POA: Diagnosis not present

## 2016-05-31 DIAGNOSIS — L814 Other melanin hyperpigmentation: Secondary | ICD-10-CM | POA: Diagnosis not present

## 2016-05-31 DIAGNOSIS — D1801 Hemangioma of skin and subcutaneous tissue: Secondary | ICD-10-CM | POA: Diagnosis not present

## 2016-05-31 DIAGNOSIS — L821 Other seborrheic keratosis: Secondary | ICD-10-CM | POA: Diagnosis not present

## 2016-05-31 DIAGNOSIS — Z85828 Personal history of other malignant neoplasm of skin: Secondary | ICD-10-CM | POA: Diagnosis not present

## 2016-05-31 DIAGNOSIS — L570 Actinic keratosis: Secondary | ICD-10-CM | POA: Diagnosis not present

## 2016-06-05 DIAGNOSIS — Z23 Encounter for immunization: Secondary | ICD-10-CM | POA: Diagnosis not present

## 2016-08-28 DIAGNOSIS — M19072 Primary osteoarthritis, left ankle and foot: Secondary | ICD-10-CM | POA: Diagnosis not present

## 2016-09-12 DIAGNOSIS — M545 Low back pain: Secondary | ICD-10-CM | POA: Diagnosis not present

## 2016-09-12 DIAGNOSIS — M9903 Segmental and somatic dysfunction of lumbar region: Secondary | ICD-10-CM | POA: Diagnosis not present

## 2016-09-12 DIAGNOSIS — M9904 Segmental and somatic dysfunction of sacral region: Secondary | ICD-10-CM | POA: Diagnosis not present

## 2016-09-12 DIAGNOSIS — M62830 Muscle spasm of back: Secondary | ICD-10-CM | POA: Diagnosis not present

## 2016-09-13 DIAGNOSIS — M25572 Pain in left ankle and joints of left foot: Secondary | ICD-10-CM | POA: Diagnosis not present

## 2016-09-14 DIAGNOSIS — M62830 Muscle spasm of back: Secondary | ICD-10-CM | POA: Diagnosis not present

## 2016-09-14 DIAGNOSIS — M545 Low back pain: Secondary | ICD-10-CM | POA: Diagnosis not present

## 2016-09-14 DIAGNOSIS — M9904 Segmental and somatic dysfunction of sacral region: Secondary | ICD-10-CM | POA: Diagnosis not present

## 2016-09-14 DIAGNOSIS — M9903 Segmental and somatic dysfunction of lumbar region: Secondary | ICD-10-CM | POA: Diagnosis not present

## 2016-09-27 DIAGNOSIS — M19072 Primary osteoarthritis, left ankle and foot: Secondary | ICD-10-CM | POA: Diagnosis not present

## 2016-10-02 DIAGNOSIS — I10 Essential (primary) hypertension: Secondary | ICD-10-CM | POA: Diagnosis not present

## 2016-10-02 DIAGNOSIS — M62830 Muscle spasm of back: Secondary | ICD-10-CM | POA: Diagnosis not present

## 2016-10-02 DIAGNOSIS — Z79899 Other long term (current) drug therapy: Secondary | ICD-10-CM | POA: Diagnosis not present

## 2016-10-02 DIAGNOSIS — R42 Dizziness and giddiness: Secondary | ICD-10-CM | POA: Diagnosis not present

## 2016-10-02 DIAGNOSIS — Z87891 Personal history of nicotine dependence: Secondary | ICD-10-CM | POA: Diagnosis not present

## 2016-10-02 DIAGNOSIS — R9082 White matter disease, unspecified: Secondary | ICD-10-CM | POA: Diagnosis not present

## 2016-10-02 DIAGNOSIS — R030 Elevated blood-pressure reading, without diagnosis of hypertension: Secondary | ICD-10-CM | POA: Diagnosis not present

## 2016-10-02 DIAGNOSIS — G9389 Other specified disorders of brain: Secondary | ICD-10-CM | POA: Diagnosis not present

## 2016-10-02 DIAGNOSIS — M9903 Segmental and somatic dysfunction of lumbar region: Secondary | ICD-10-CM | POA: Diagnosis not present

## 2016-10-02 DIAGNOSIS — M9904 Segmental and somatic dysfunction of sacral region: Secondary | ICD-10-CM | POA: Diagnosis not present

## 2016-10-02 DIAGNOSIS — E079 Disorder of thyroid, unspecified: Secondary | ICD-10-CM | POA: Diagnosis not present

## 2016-10-02 DIAGNOSIS — M545 Low back pain: Secondary | ICD-10-CM | POA: Diagnosis not present

## 2016-10-26 ENCOUNTER — Emergency Department (HOSPITAL_COMMUNITY): Payer: Medicare HMO

## 2016-10-26 ENCOUNTER — Inpatient Hospital Stay (HOSPITAL_COMMUNITY)
Admission: EM | Admit: 2016-10-26 | Discharge: 2016-10-30 | DRG: 683 | Disposition: A | Discharge status: Skilled Nursing Facility | Payer: Medicare HMO | Attending: Family Medicine | Admitting: Family Medicine

## 2016-10-26 ENCOUNTER — Encounter (HOSPITAL_COMMUNITY): Payer: Self-pay

## 2016-10-26 DIAGNOSIS — N390 Urinary tract infection, site not specified: Secondary | ICD-10-CM | POA: Diagnosis present

## 2016-10-26 DIAGNOSIS — R531 Weakness: Secondary | ICD-10-CM | POA: Diagnosis present

## 2016-10-26 DIAGNOSIS — W19XXXA Unspecified fall, initial encounter: Secondary | ICD-10-CM

## 2016-10-26 DIAGNOSIS — I440 Atrioventricular block, first degree: Secondary | ICD-10-CM | POA: Diagnosis present

## 2016-10-26 DIAGNOSIS — E785 Hyperlipidemia, unspecified: Secondary | ICD-10-CM | POA: Diagnosis present

## 2016-10-26 DIAGNOSIS — Z85820 Personal history of malignant melanoma of skin: Secondary | ICD-10-CM | POA: Diagnosis not present

## 2016-10-26 DIAGNOSIS — E039 Hypothyroidism, unspecified: Secondary | ICD-10-CM | POA: Diagnosis present

## 2016-10-26 DIAGNOSIS — M25552 Pain in left hip: Secondary | ICD-10-CM | POA: Diagnosis not present

## 2016-10-26 DIAGNOSIS — N179 Acute kidney failure, unspecified: Principal | ICD-10-CM | POA: Diagnosis present

## 2016-10-26 DIAGNOSIS — M25561 Pain in right knee: Secondary | ICD-10-CM

## 2016-10-26 DIAGNOSIS — Z79899 Other long term (current) drug therapy: Secondary | ICD-10-CM | POA: Diagnosis not present

## 2016-10-26 DIAGNOSIS — Z8249 Family history of ischemic heart disease and other diseases of the circulatory system: Secondary | ICD-10-CM | POA: Diagnosis not present

## 2016-10-26 DIAGNOSIS — N183 Chronic kidney disease, stage 3 (moderate): Secondary | ICD-10-CM | POA: Diagnosis present

## 2016-10-26 DIAGNOSIS — Z823 Family history of stroke: Secondary | ICD-10-CM | POA: Diagnosis not present

## 2016-10-26 DIAGNOSIS — I129 Hypertensive chronic kidney disease with stage 1 through stage 4 chronic kidney disease, or unspecified chronic kidney disease: Secondary | ICD-10-CM | POA: Diagnosis present

## 2016-10-26 DIAGNOSIS — Z87891 Personal history of nicotine dependence: Secondary | ICD-10-CM

## 2016-10-26 DIAGNOSIS — I10 Essential (primary) hypertension: Secondary | ICD-10-CM | POA: Diagnosis not present

## 2016-10-26 DIAGNOSIS — E860 Dehydration: Secondary | ICD-10-CM | POA: Diagnosis present

## 2016-10-26 DIAGNOSIS — M170 Bilateral primary osteoarthritis of knee: Secondary | ICD-10-CM | POA: Diagnosis present

## 2016-10-26 DIAGNOSIS — R32 Unspecified urinary incontinence: Secondary | ICD-10-CM | POA: Diagnosis present

## 2016-10-26 DIAGNOSIS — G8929 Other chronic pain: Secondary | ICD-10-CM

## 2016-10-26 DIAGNOSIS — R52 Pain, unspecified: Secondary | ICD-10-CM

## 2016-10-26 DIAGNOSIS — M25569 Pain in unspecified knee: Secondary | ICD-10-CM | POA: Diagnosis not present

## 2016-10-26 LAB — URINALYSIS, ROUTINE W REFLEX MICROSCOPIC
BILIRUBIN URINE: NEGATIVE
GLUCOSE, UA: NEGATIVE mg/dL
Hgb urine dipstick: NEGATIVE
KETONES UR: NEGATIVE mg/dL
NITRITE: POSITIVE — AB
PH: 5 (ref 5.0–8.0)
Protein, ur: 100 mg/dL — AB
Specific Gravity, Urine: 1.018 (ref 1.005–1.030)

## 2016-10-26 LAB — CBC WITH DIFFERENTIAL/PLATELET
Basophils Absolute: 0 10*3/uL (ref 0.0–0.1)
Basophils Relative: 0 %
EOS ABS: 0.2 10*3/uL (ref 0.0–0.7)
EOS PCT: 2 %
HCT: 39.7 % (ref 36.0–46.0)
HEMOGLOBIN: 13.1 g/dL (ref 12.0–15.0)
LYMPHS ABS: 1.1 10*3/uL (ref 0.7–4.0)
Lymphocytes Relative: 12 %
MCH: 33.6 pg (ref 26.0–34.0)
MCHC: 33 g/dL (ref 30.0–36.0)
MCV: 101.8 fL — ABNORMAL HIGH (ref 78.0–100.0)
MONO ABS: 0.8 10*3/uL (ref 0.1–1.0)
MONOS PCT: 8 %
Neutro Abs: 7.4 10*3/uL (ref 1.7–7.7)
Neutrophils Relative %: 78 %
Platelets: 208 10*3/uL (ref 150–400)
RBC: 3.9 MIL/uL (ref 3.87–5.11)
RDW: 14.1 % (ref 11.5–15.5)
WBC: 9.5 10*3/uL (ref 4.0–10.5)

## 2016-10-26 LAB — COMPREHENSIVE METABOLIC PANEL
ALK PHOS: 59 U/L (ref 38–126)
ALT: 18 U/L (ref 14–54)
ANION GAP: 13 (ref 5–15)
AST: 28 U/L (ref 15–41)
Albumin: 4 g/dL (ref 3.5–5.0)
BUN: 30 mg/dL — ABNORMAL HIGH (ref 6–20)
CO2: 27 mmol/L (ref 22–32)
Calcium: 9.3 mg/dL (ref 8.9–10.3)
Chloride: 100 mmol/L — ABNORMAL LOW (ref 101–111)
Creatinine, Ser: 1.71 mg/dL — ABNORMAL HIGH (ref 0.44–1.00)
GFR calc non Af Amer: 26 mL/min — ABNORMAL LOW (ref >60)
GFR, EST AFRICAN AMERICAN: 30 mL/min — AB (ref >60)
Glucose, Bld: 116 mg/dL — ABNORMAL HIGH (ref 65–99)
POTASSIUM: 3.3 mmol/L — AB (ref 3.5–5.1)
SODIUM: 140 mmol/L (ref 135–145)
TOTAL PROTEIN: 7.1 g/dL (ref 6.5–8.1)
Total Bilirubin: 0.6 mg/dL (ref 0.3–1.2)

## 2016-10-26 MED ORDER — SODIUM CHLORIDE 0.9 % IV SOLN
INTRAVENOUS | Status: AC
  Administered 2016-10-26: 22:00:00 via INTRAVENOUS

## 2016-10-26 MED ORDER — DEXTROSE 5 % IV SOLN
1.0000 g | Freq: Once | INTRAVENOUS | Status: AC
  Administered 2016-10-27: 1 g via INTRAVENOUS
  Filled 2016-10-26: qty 10

## 2016-10-26 NOTE — ED Provider Notes (Signed)
[place] [facility] DEPT Provider Note   CSN: [id number] Arrival date & time: 10/26/16  2029     History   Chief Complaint Chief Complaint  Patient presents with  . Extremity Weakness    HPI Taylor Giles is a 83 y.o. female.  HPI  The patient is a 83 year old female, history of anemia, chronic kidney disease, hyperlipidemia, hypertension, hypothyroidism as well as a history of multiple episodes of syncope. She presents to the hospital today with a complaint of generalized weakness but specifically feels weak in her right leg. She has noted that she has had some difficulty with ambulation and in fact over the last couple of days has burst with been unable to get out of bed by herself. She has been stuck in her bathroom twice unable to get up off of the edge of the tub because her right leg is weak and she feels as though she is given a fall. In the last couple of weeks she was out of town visiting with the family member when she had some episodes of dizziness and was actually seen in the emergency department where she had a CT scan of the brain which was negative. She is started to have some difficulty with her right leg approximately one week ago. This has been persistent, she cannot tell whether it hurts or whether it is weak but states that she feels like it just won't work. She has had a prior knee replacement on that side. She has also had pain in her left hip which she cannot explain it seems to worsen today as well. She denies fevers or chills, she does have a runny nose and occasional coughing.  Past Medical History:  Diagnosis Date  . Anemia   . Arthritis   . Chronic kidney disease    due to use of votaren, sees Dr. Lowanda Foster  . Gall stones   . Hyperlipidemia   . Hypertension   . Hypothyroidism   . Melanoma ([place])    left elbow  s/p excision 1982  . Mitral valve prolapse   . Osteopenia   . PONV (postoperative nausea and vomiting)   . Syncope     Patient Active Problem List     Diagnosis Date Noted  . Bradycardia 10/05/2013  . Syncope 02/26/2013  . HTN (hypertension) 02/26/2013  . Hyperlipidemia 02/26/2013  . Cystocele 12/11/2011  . NONSPECIFIC ABN FINDING RAD & OTH EXAM GI TRACT 12/20/2009    Past Surgical History:  Procedure Laterality Date  . ANKLE FUSION  2009   Right  . APPENDECTOMY  1967   with removal of ovarian cyst  . BUNIONECTOMY     bilateral  . CATARACT EXTRACTION, BILATERAL    . CYSTOCELE REPAIR  12/11/2011   Procedure: ANTERIOR REPAIR (CYSTOCELE);  Surgeon: Jonnie Kind, MD;  Location: [facility];  Service: Gynecology;  Laterality: N/A;  . EXAMINATION UNDER ANESTHESIA  12/11/2011   Procedure: EXAM UNDER ANESTHESIA;  Surgeon: Jonnie Kind, MD;  Location: [facility];  Service: Gynecology;  Laterality: N/A;  . EXCISION MORTON'S NEUROMA     left?  . FOREIGN BODY REMOVAL VAGINAL  12/11/2011   Procedure: REMOVAL FOREIGN BODY VAGINAL;  Surgeon: Jonnie Kind, MD;  Location: [facility];  Service: Gynecology;  Laterality: N/A;  impacted pessary  . ORIF ELBOW FRACTURE  age 40   after fx at age 25, Left  . PARATHYROID EXPLORATION  2004   removal of 2 adenoma tumors   . THYROID SURGERY  1950   removal of tumor, adenoma  . ULNAR NERVE TRANSPOSITION  age 24    OB History    No data available       Home Medications    Prior to Admission medications   Medication Sig Start Date End Date Taking? Authorizing Provider  acetaminophen (TYLENOL) 325 MG tablet Take 1,300 mg by mouth every morning. And as needed   Yes Historical Provider, MD  Calcium Acetate 667 MG TABS Take 667 mg by mouth 3 (three) times daily.   Yes Historical Provider, MD  Cholecalciferol (VITAMIN D-3) 1000 UNITS CAPS Take 1,000 Units by mouth daily.   Yes Historical Provider, MD  doxazosin (CARDURA) 1 MG tablet Take 1 tablet (1 mg total) by mouth 2 (two) times daily. 08/25/13  Yes Pixie Casino, MD  hydrochlorothiazide (HYDRODIURIL) 25 MG tablet Take 25 mg by mouth daily.   Yes  Historical Provider, MD  HYDROcodone-acetaminophen (NORCO/VICODIN) 5-325 MG per tablet Take 1 tablet by mouth every 4 (four) hours as needed for moderate pain.   Yes Historical Provider, MD  levothyroxine (SYNTHROID, LEVOTHROID) 112 MCG tablet Take 112 mcg by mouth daily.   Yes Historical Provider, MD  Magnesium 250 MG TABS Take 250 mg by mouth at bedtime.   Yes Historical Provider, MD  metoprolol succinate (TOPROL-XL) 100 MG 24 hr tablet Take 50 mg by mouth daily. Take with or immediately following a meal.   Yes Historical Provider, MD  MYRBETRIQ 25 MG TB24 tablet Take 25 mg by mouth at bedtime. 09/28/14  Yes Historical Provider, MD  verapamil (CALAN-SR) 240 MG CR tablet Take 240 mg by mouth at bedtime.   Yes Historical Provider, MD  vitamin C (ASCORBIC ACID) 500 MG tablet Take 500 mg by mouth daily.   Yes Historical Provider, MD  levothyroxine (SYNTHROID, LEVOTHROID) 125 MCG tablet Take 125 mcg by mouth daily. 08/04/16   Historical Provider, MD    Family History Family History  Problem Relation Age of Onset  . Hypertension Father   . Heart attack Father   . Stroke Father   . Hypertension Brother   . Kidney disease Brother   . Heart attack Daughter   . Anesthesia problems Neg Hx   . Hypotension Neg Hx   . Pseudochol deficiency Neg Hx   . Malignant hyperthermia Neg Hx     Social History Social History  Substance Use Topics  . Smoking status: Former Smoker    Packs/day: 0.20    Years: 60.00    Quit date: 07/30/2014  . Smokeless tobacco: Never Used  . Alcohol use No     Allergies   Ciprofloxacin; Gabapentin; Nsaids; Nitrofurantoin; and Terbinafine hcl   Review of Systems Review of Systems  All other systems reviewed and are negative.    Physical Exam Updated Vital Signs Ht 5\' 7"  (1.702 m)   Wt 150 lb (68 kg)   BMI 23.49 kg/m   Physical Exam  Constitutional: She appears well-developed and well-nourished. No distress.  HENT:  Head: Normocephalic and atraumatic.    Mouth/Throat: Oropharynx is clear and moist. No oropharyngeal exudate.  Eyes: Conjunctivae and EOM are normal. Pupils are equal, round, and reactive to light. Right eye exhibits no discharge. Left eye exhibits no discharge. No scleral icterus.  Neck: Normal range of motion. Neck supple. No JVD present. No thyromegaly present.  Cardiovascular: Normal rate, regular rhythm, normal heart sounds and intact distal pulses.  Exam reveals no gallop and no friction rub.   No murmur  heard. Pulmonary/Chest: Effort normal and breath sounds normal. No respiratory distress. She has no wheezes. She has no rales.  Abdominal: Soft. Bowel sounds are normal. She exhibits no distension and no mass. There is no tenderness.  Musculoskeletal: Normal range of motion. She exhibits no edema or tenderness.  The patient has normal range of motion of the bilateral lower extremities, she has had a prior knee replacement on the right, there is an incision which is well-healed anterior over the patella. There is no deformity or leg length discrepancy  Lymphadenopathy:    She has no cervical adenopathy.  Neurological: She is alert. Coordination normal.  The patient has normal strength in the bilateral upper extremities, the left side is slightly worse in the right however she states she has had a prior fracture of her left arm is weaker on that side at baseline. She is able to straight leg raise bilaterally, she has  apparently normal strength at both the ankles and the knees and the hipswith isolated movements and testing strength in those isolated areas however when she gets up to walk she seems to not be able to carry weight well on her right leg.  Cranial nerves III through XII appear normal  Skin: Skin is warm and dry. No rash noted. No erythema.  Psychiatric: She has a normal mood and affect. Her behavior is normal.  Nursing note and vitals reviewed.    ED Treatments / Results  Labs (all labs ordered are listed, but only  abnormal results are displayed) Labs Reviewed  CBC WITH DIFFERENTIAL/PLATELET - Abnormal; Notable for the following:       Result Value   MCV 101.8 (*)    All other components within normal limits  COMPREHENSIVE METABOLIC PANEL  URINALYSIS, ROUTINE W REFLEX MICROSCOPIC    EKG  EKG Interpretation  Date/Time:  Friday October 26 2016 22:25:34 EST Ventricular Rate:  75 PR Interval:    QRS Duration: 86 QT Interval:  419 QTC Calculation: 468 R Axis:   -38 Text Interpretation:  Sinus rhythm Atrial premature complex Prolonged PR interval Abnormal R-wave progression, early transition Probable left ventricular hypertrophy Inferior infarct, old since last tracing no significant change Confirmed by Sabra Heck  MD, Eliot Bencivenga ([id number]) on 10/26/2016 10:29:42 PM       Radiology Dg Chest Port 1 View  Result Date: 10/26/2016 CLINICAL DATA:  Right leg weakness EXAM: PORTABLE CHEST 1 VIEW COMPARISON:  Chest radiograph 09/13/2014 FINDINGS: Mild cardiomegaly with atherosclerotic calcification in the aortic arch. Bibasilar atelectasis. No focal airspace consolidation or pulmonary edema. No pneumothorax or sizable pleural effusion. Shallow lung inflation. IMPRESSION: Hypoinflation and bibasilar atelectasis.  Aortic atherosclerosis. Electronically Signed   By: Ulyses Jarred M.D.   On: 10/26/2016 22:19    Procedures Procedures (including critical care time)  Medications Ordered in ED Medications  0.9 %  sodium chloride infusion ( Intravenous New Bag/Given 10/26/16 2154)     Initial Impression / Assessment and Plan / ED Course  I have reviewed the triage vital signs and the nursing notes.  Pertinent labs & imaging results that were available during my care of the patient were reviewed by me and considered in my medical decision making (see chart for details).    The etiology of the patient's symptoms is unclear. I would be concerned somewhat for stroke given the asymmetrical nature of the weakness according to  the patient's report however on exam I cannot see any focal weakness, there is no ataxia, there is no cranial nerve  abnormalities. She has had a prior knee replacement but does not have any effusion redness warmth or significant pain with range of motion of the knee. I doubt that this is a septic joint. Anticipate admission as the patient is unable to ambulate and lives by herself  At change of shift - care signed out to Dr. Lita Mains Anticipate admission.  Final Clinical Impressions(s) / ED Diagnoses   Final diagnoses:  None    New Prescriptions New Prescriptions   No medications on file     Noemi Chapel, MD 10/26/16 2236

## 2016-10-26 NOTE — ED Notes (Signed)
Assisted Dr. Sabra Heck with ambulating patient out to hall and back.

## 2016-10-26 NOTE — ED Provider Notes (Signed)
Imaging without any acute abnormalities. Evidence of UTI on UA. Given IV Rocephin. Dr.Le will admit.   Julianne Rice, MD 10/26/16 [phone]

## 2016-10-26 NOTE — ED Notes (Signed)
Pt reports R knee weakness started earlier this week, denies injury or fall. Today she got out of bed with ease and sat on the side of the tub to put shoes on and stated she could not get back up again. Neighbor helped pt up after 5 hours. Able to ambulate and lower self without difficulty, difficulty getting out of seated position.

## 2016-10-26 NOTE — ED Triage Notes (Signed)
Pt had a right total knee replacement in 2016.  Pt states she is weak on that leg.

## 2016-10-27 ENCOUNTER — Inpatient Hospital Stay (HOSPITAL_COMMUNITY): Payer: Medicare HMO

## 2016-10-27 DIAGNOSIS — E039 Hypothyroidism, unspecified: Secondary | ICD-10-CM | POA: Diagnosis present

## 2016-10-27 DIAGNOSIS — M25552 Pain in left hip: Secondary | ICD-10-CM | POA: Diagnosis not present

## 2016-10-27 DIAGNOSIS — Z79899 Other long term (current) drug therapy: Secondary | ICD-10-CM | POA: Diagnosis not present

## 2016-10-27 DIAGNOSIS — I440 Atrioventricular block, first degree: Secondary | ICD-10-CM | POA: Diagnosis present

## 2016-10-27 DIAGNOSIS — R531 Weakness: Secondary | ICD-10-CM

## 2016-10-27 DIAGNOSIS — E785 Hyperlipidemia, unspecified: Secondary | ICD-10-CM | POA: Diagnosis present

## 2016-10-27 DIAGNOSIS — E860 Dehydration: Secondary | ICD-10-CM | POA: Diagnosis present

## 2016-10-27 DIAGNOSIS — N390 Urinary tract infection, site not specified: Secondary | ICD-10-CM | POA: Diagnosis present

## 2016-10-27 DIAGNOSIS — M170 Bilateral primary osteoarthritis of knee: Secondary | ICD-10-CM | POA: Diagnosis present

## 2016-10-27 DIAGNOSIS — N179 Acute kidney failure, unspecified: Secondary | ICD-10-CM | POA: Diagnosis present

## 2016-10-27 DIAGNOSIS — Z87891 Personal history of nicotine dependence: Secondary | ICD-10-CM | POA: Diagnosis not present

## 2016-10-27 DIAGNOSIS — I129 Hypertensive chronic kidney disease with stage 1 through stage 4 chronic kidney disease, or unspecified chronic kidney disease: Secondary | ICD-10-CM | POA: Diagnosis present

## 2016-10-27 DIAGNOSIS — Z823 Family history of stroke: Secondary | ICD-10-CM | POA: Diagnosis not present

## 2016-10-27 DIAGNOSIS — R32 Unspecified urinary incontinence: Secondary | ICD-10-CM | POA: Diagnosis present

## 2016-10-27 DIAGNOSIS — N183 Chronic kidney disease, stage 3 (moderate): Secondary | ICD-10-CM | POA: Diagnosis present

## 2016-10-27 DIAGNOSIS — Z8249 Family history of ischemic heart disease and other diseases of the circulatory system: Secondary | ICD-10-CM | POA: Diagnosis not present

## 2016-10-27 DIAGNOSIS — Z85820 Personal history of malignant melanoma of skin: Secondary | ICD-10-CM | POA: Diagnosis not present

## 2016-10-27 DIAGNOSIS — I10 Essential (primary) hypertension: Secondary | ICD-10-CM

## 2016-10-27 LAB — TSH: TSH: 40.26 u[IU]/mL — ABNORMAL HIGH (ref 0.350–4.500)

## 2016-10-27 LAB — TROPONIN I
Troponin I: <0.03 ng/mL (ref <0.03)
Troponin I: <0.03 ng/mL (ref <0.03)

## 2016-10-27 MED ORDER — HEPARIN SODIUM (PORCINE) 5000 UNIT/ML IJ SOLN
5000.0000 [IU] | Freq: Three times a day (TID) | INTRAMUSCULAR | Status: DC
  Administered 2016-10-27 – 2016-10-30 (×10): 5000 [IU] via SUBCUTANEOUS
  Filled 2016-10-27 (×10): qty 1

## 2016-10-27 MED ORDER — VITAMIN C 500 MG PO TABS
500.0000 mg | ORAL_TABLET | Freq: Every day | ORAL | Status: DC
  Administered 2016-10-27 – 2016-10-30 (×4): 500 mg via ORAL
  Filled 2016-10-27 (×4): qty 1

## 2016-10-27 MED ORDER — ACETAMINOPHEN 325 MG PO TABS: 325.0000 mg | ORAL_TABLET | ORAL | Status: DC | PRN

## 2016-10-27 MED ORDER — SODIUM CHLORIDE 0.9% FLUSH
3.0000 mL | Freq: Two times a day (BID) | INTRAVENOUS | Status: DC
  Administered 2016-10-27 – 2016-10-28 (×3): 3 mL via INTRAVENOUS

## 2016-10-27 MED ORDER — CEFTRIAXONE SODIUM 1 G IJ SOLR
1.0000 g | INTRAMUSCULAR | Status: DC
  Filled 2016-10-27 (×2): qty 10

## 2016-10-27 MED ORDER — MAGNESIUM 200 MG PO TABS
250.0000 mg | ORAL_TABLET | Freq: Every day | ORAL | Status: DC
  Administered 2016-10-27: 250 mg via ORAL
  Filled 2016-10-27 (×3): qty 2
  Filled 2016-10-27: qty 1

## 2016-10-27 MED ORDER — METOPROLOL SUCCINATE ER 50 MG PO TB24
50.0000 mg | ORAL_TABLET | Freq: Every day | ORAL | Status: DC
  Administered 2016-10-27 – 2016-10-30 (×4): 50 mg via ORAL
  Filled 2016-10-27 (×4): qty 1

## 2016-10-27 MED ORDER — VERAPAMIL HCL ER 240 MG PO TBCR
120.0000 mg | EXTENDED_RELEASE_TABLET | Freq: Every day | ORAL | Status: DC
  Administered 2016-10-27 – 2016-10-30 (×4): 120 mg via ORAL
  Filled 2016-10-27 (×4): qty 1

## 2016-10-27 MED ORDER — VITAMIN D 1000 UNITS PO TABS
1000.0000 [IU] | ORAL_TABLET | Freq: Every day | ORAL | Status: DC
  Administered 2016-10-27 – 2016-10-30 (×4): 1000 [IU] via ORAL
  Filled 2016-10-27 (×4): qty 1

## 2016-10-27 MED ORDER — MIRABEGRON ER 25 MG PO TB24
25.0000 mg | ORAL_TABLET | Freq: Every day | ORAL | Status: DC
  Administered 2016-10-27 – 2016-10-29 (×4): 25 mg via ORAL
  Filled 2016-10-27 (×4): qty 1

## 2016-10-27 MED ORDER — MAGNESIUM OXIDE 400 (241.3 MG) MG PO TABS
200.0000 mg | ORAL_TABLET | Freq: Every day | ORAL | Status: DC
  Administered 2016-10-27 – 2016-10-29 (×3): 200 mg via ORAL
  Filled 2016-10-27 (×3): qty 1

## 2016-10-27 MED ORDER — LEVOTHYROXINE SODIUM 25 MCG PO TABS
125.0000 ug | ORAL_TABLET | Freq: Every day | ORAL | Status: DC
  Administered 2016-10-27 – 2016-10-28 (×2): 125 ug via ORAL
  Filled 2016-10-27 (×2): qty 1

## 2016-10-27 MED ORDER — CALCIUM ACETATE (PHOS BINDER) 667 MG PO CAPS
667.0000 mg | ORAL_CAPSULE | Freq: Three times a day (TID) | ORAL | Status: DC
  Administered 2016-10-27 – 2016-10-30 (×11): 667 mg via ORAL
  Filled 2016-10-27 (×11): qty 1

## 2016-10-27 MED ORDER — GUAIFENESIN 100 MG/5ML PO SOLN
5.0000 mL | ORAL | Status: DC | PRN
  Administered 2016-10-27: 100 mg via ORAL
  Filled 2016-10-27: qty 5

## 2016-10-27 MED ORDER — DEXTROSE-NACL 5-0.9 % IV SOLN
INTRAVENOUS | Status: DC
  Administered 2016-10-27: 02:00:00 via INTRAVENOUS

## 2016-10-27 NOTE — Evaluation (Signed)
Physical Therapy Evaluation Patient Details Name: Taylor Giles MRN: [id number] DOB: [birth year] Today's Date: 10/27/2016   History of Present Illness  Taylor Giles is an 83 y.o. female with hx of bilateral knee arthritis, hx of MVP, HTN, HLD, hypothyroidism, just came back to her home from staying at her daughter's house, felt weak, unable to ambulate.  The neighbors said they had to give her full assist to stand her up.  Work up in the ER included a negative head CT, Cr of 1.7 with K of 3.3, and normal WBC and HB.  UA showed TNTC WBCs, and hospitalist was asked to admit her for unable to walk, living alone, seeking STR, and UTI.   Clinical Impression  Pt mobility greatly improved since last night.  I anticipate pt returning to prior level of function once she is hydrated And has received the antibiotics for her UTI.  Pt will benefit from Home health to improve mobility due to increased  Right knee pain and left hip pain.    Follow Up Recommendations Home health PT;SNF (Pt should progress well once UTI is addressed  if able to come sit to stand I may have [place])    Equipment Recommendations    none   Recommendations for Other Services       Precautions / Restrictions Precautions Precautions: None Restrictions Weight Bearing Restrictions: No      Mobility  Bed Mobility Overal bed mobility: Independent Bed Mobility: Supine to Sit     Supine to sit: Independent        Transfers Overall transfer level: Needs assistance Equipment used: Rolling walker (2 wheeled) Transfers: Sit to/from Stand Sit to Stand: Mod assist            Ambulation/Gait Ambulation/Gait assistance: Supervision Ambulation Distance (Feet): 40 Feet Assistive device: Rolling walker (2 wheeled) Gait Pattern/deviations: Decreased step length - right;Decreased step length - left   Gait velocity interpretation: Below normal speed for age/gender    Stairs            Wheelchair Mobility     Modified Rankin (Stroke Patients Only)       Balance                                             Pertinent Vitals/Pain Pain Assessment: 0-10 Pain Score: 5  Pain Location: Rt knee Pain Descriptors / Indicators: Discomfort Pain Intervention(s): Limited activity within patient's tolerance    Home Living Family/patient expects to be discharged to:: Private residence Living Arrangements: Alone   Type of Home: House         Home Equipment: Environmental consultant - 2 wheels;Bedside commode      Prior Function    Mod I with rolling walker           Hand Dominance        Extremity/Trunk Assessment        Lower Extremity Assessment Lower Extremity Assessment: Overall WFL for tasks assessed       Communication    I  Cognition Arousal/Alertness: Awake/alert   Overall Cognitive Status: Within Functional Limits for tasks assessed                         Exercises General Exercises - Lower Extremity Ankle Circles/Pumps: Both;10 reps Quad Sets: Both;10 reps Hip ABduction/ADduction: Both;10 reps Straight Leg  Raises: Both;10 reps   Assessment/Plan    PT Assessment Patient needs continued PT services  PT Problem List Decreased activity tolerance          PT Treatment Interventions Gait training;Functional mobility training;Therapeutic exercise    PT Goals (Current goals can be found in the Care Plan section)  Acute Rehab PT Goals PT Goal Formulation: With patient Time For Goal Achievement: 10/30/16 Potential to Achieve Goals: Good    Frequency Min 3X/week   Barriers to discharge           End of Session Equipment Utilized During Treatment: Gait belt Activity Tolerance: Patient tolerated treatment well Patient left: in chair           Time: 1100-1135 PT Time Calculation (min) (ACUTE ONLY): 35 min   Charges:   PT Evaluation $PT Eval Moderate Complexity: 1 Procedure PT Treatments $Therapeutic Exercise: 8-22 mins   PT G  CodesRayetta Humphrey, PT CLT [phone] 10/27/2016, 11:36 AM

## 2016-10-27 NOTE — H&P (Addendum)
History and Physical    Taylor Giles [id number] DOB: 17-Feb-1928 DOA: 10/26/2016  PCP: Taylor Kilts, MD  Patient coming from: Home.    Chief Complaint:   Weakness, unable to ambulate.  Lives alone.   HPI: Taylor Giles is an 83 y.o. female with hx of bilateral knee arthritis, hx of MVP, HTN, HLD, hypothyroidism, just came back to her home from staying at her daughter's house, felt weak, unable to ambulate.  The neighbors said they had to give her full assist to stand her up.  Work up in the ER included a negative head CT, Cr of 1.7 with K of 3.3, and normal WBC and HB.  UA showed TNTC WBCs, and hospitalist was asked to admit her for unable to walk, living alone, seeking STR, and UTI.   ED Course:  See above.  Rewiew of Systems:  Constitutional: Negative for malaise, fever and chills. No significant weight loss or weight gain Eyes: Negative for eye pain, redness and discharge, diplopia, visual changes, or flashes of light. ENMT: Negative for ear pain, hoarseness, nasal congestion, sinus pressure and sore throat. No headaches; tinnitus, drooling, or problem swallowing. Cardiovascular: Negative for chest pain, palpitations, diaphoresis, dyspnea and peripheral edema. ; No orthopnea, PND Respiratory: Negative for cough, hemoptysis, wheezing and stridor. No pleuritic chestpain. Gastrointestinal: Negative for diarrhea, constipation,  melena, blood in stool, hematemesis, jaundice and rectal bleeding.    Genitourinary: Negative for frequency, dysuria, incontinence,flank pain and hematuria; Musculoskeletal: Negative for back pain and neck pain. Negative for swelling and trauma.;  Skin: . Negative for pruritus, rash, abrasions, bruising and skin lesion.; ulcerations Neuro: Negative for headache, lightheadedness and neck stiffness. Negative for weakness, altered level of consciousness , altered mental status, extremity weakness, burning feet, involuntary movement, seizure and syncope.    Psych: negative for anxiety, depression, insomnia, tearfulness, panic attacks, hallucinations, paranoia, suicidal or homicidal ideation    Past Medical History:  Diagnosis Date  . Anemia   . Arthritis   . Chronic kidney disease    due to use of votaren, sees Dr. Lowanda Foster  . Gall stones   . Hyperlipidemia   . Hypertension   . Hypothyroidism   . Melanoma ([place])    left elbow  s/p excision 1982  . Mitral valve prolapse   . Osteopenia   . PONV (postoperative nausea and vomiting)   . Syncope    Past Surgical History:  Procedure Laterality Date  . ANKLE FUSION  2009   Right  . APPENDECTOMY  1967   with removal of ovarian cyst  . BUNIONECTOMY     bilateral  . CATARACT EXTRACTION, BILATERAL    . CYSTOCELE REPAIR  12/11/2011   Procedure: ANTERIOR REPAIR (CYSTOCELE);  Surgeon: Jonnie Kind, MD;  Location: [facility];  Service: Gynecology;  Laterality: N/A;  . EXAMINATION UNDER ANESTHESIA  12/11/2011   Procedure: EXAM UNDER ANESTHESIA;  Surgeon: Jonnie Kind, MD;  Location: [facility];  Service: Gynecology;  Laterality: N/A;  . EXCISION MORTON'S NEUROMA     left?  . FOREIGN BODY REMOVAL VAGINAL  12/11/2011   Procedure: REMOVAL FOREIGN BODY VAGINAL;  Surgeon: Jonnie Kind, MD;  Location: [facility];  Service: Gynecology;  Laterality: N/A;  impacted pessary  . ORIF ELBOW FRACTURE  age 51   after fx at age 33, Left  . PARATHYROID EXPLORATION  2004   removal of 2 adenoma tumors   . THYROID SURGERY  1950   removal of tumor, adenoma  .  ULNAR NERVE TRANSPOSITION  age 32     reports that she quit smoking about 2 years ago. She has a 12.00 pack-year smoking history. She has never used smokeless tobacco. She reports that she does not drink alcohol or use drugs.  Allergies  Allergen Reactions  . Ciprofloxacin     REACTION: itchy rash  . Gabapentin   . Nsaids     Nephrologist ordered not to take d/t chronic kidney disease  . Nitrofurantoin Rash    macrobid  . Terbinafine Hcl Rash     Family History  Problem Relation Age of Onset  . Hypertension Father   . Heart attack Father   . Stroke Father   . Hypertension Brother   . Kidney disease Brother   . Heart attack Daughter   . Anesthesia problems Neg Hx   . Hypotension Neg Hx   . Pseudochol deficiency Neg Hx   . Malignant hyperthermia Neg Hx      Prior to Admission medications   Medication Sig Start Date End Date Taking? Authorizing Provider  acetaminophen (TYLENOL) 325 MG tablet Take 1,300 mg by mouth every morning. And as needed   Yes Historical Provider, MD  Calcium Acetate 667 MG TABS Take 667 mg by mouth 3 (three) times daily.   Yes Historical Provider, MD  Cholecalciferol (VITAMIN D-3) 1000 UNITS CAPS Take 1,000 Units by mouth daily.   Yes Historical Provider, MD  doxazosin (CARDURA) 1 MG tablet Take 1 tablet (1 mg total) by mouth 2 (two) times daily. 08/25/13  Yes Pixie Casino, MD  hydrochlorothiazide (HYDRODIURIL) 25 MG tablet Take 25 mg by mouth daily.   Yes Historical Provider, MD  HYDROcodone-acetaminophen (NORCO/VICODIN) 5-325 MG per tablet Take 1 tablet by mouth every 4 (four) hours as needed for moderate pain.   Yes Historical Provider, MD  levothyroxine (SYNTHROID, LEVOTHROID) 112 MCG tablet Take 112 mcg by mouth daily.   Yes Historical Provider, MD  Magnesium 250 MG TABS Take 250 mg by mouth at bedtime.   Yes Historical Provider, MD  metoprolol succinate (TOPROL-XL) 100 MG 24 hr tablet Take 50 mg by mouth daily. Take with or immediately following a meal.   Yes Historical Provider, MD  MYRBETRIQ 25 MG TB24 tablet Take 25 mg by mouth at bedtime. 09/28/14  Yes Historical Provider, MD  verapamil (CALAN-SR) 240 MG CR tablet Take 240 mg by mouth at bedtime.   Yes Historical Provider, MD  vitamin C (ASCORBIC ACID) 500 MG tablet Take 500 mg by mouth daily.   Yes Historical Provider, MD  levothyroxine (SYNTHROID, LEVOTHROID) 125 MCG tablet Take 125 mcg by mouth daily. 08/04/16   Historical Provider, MD     Physical Exam: Vitals:   10/26/16 2034 10/26/16 2100  BP:  118/67  Pulse:  76  Resp:  18  SpO2:  97%  Weight: 68 kg (150 lb)   Height: 5\' 7"  (1.702 m)       Constitutional: NAD, calm, comfortable Vitals:   10/26/16 2034 10/26/16 2100  BP:  118/67  Pulse:  76  Resp:  18  SpO2:  97%  Weight: 68 kg (150 lb)   Height: 5\' 7"  (1.702 m)    Eyes: PERRL, lids and conjunctivae normal ENMT: Mucous membranes are moist. Posterior pharynx clear of any exudate or lesions.Normal dentition.  Neck: normal, supple, no masses, no thyromegaly Respiratory: clear to auscultation bilaterally, no wheezing, no crackles. Normal respiratory effort. No accessory muscle use.  Cardiovascular: Regular rate and rhythm, no murmurs /  rubs / gallops. No extremity edema. 2+ pedal pulses. No carotid bruits.  Abdomen: no tenderness, no masses palpated. No hepatosplenomegaly. Bowel sounds positive.  Musculoskeletal: no clubbing / cyanosis. No joint deformity upper and lower extremities. Good ROM, no contractures. Normal muscle tone.  Skin: no rashes, lesions, ulcers. No induration Neurologic: CN 2-12 grossly intact. Sensation intact, DTR normal. Strength 5/5 in all 4.  Psychiatric: Normal judgment and insight. Alert and oriented x 3. Normal mood.    Labs on Admission: I have personally reviewed following labs and imaging studies CBC:  Recent Labs Lab 10/26/16 2142  WBC 9.5  NEUTROABS 7.4  HGB 13.1  HCT 39.7  MCV 101.8*  PLT 123XX123   Basic Metabolic Panel:  Recent Labs Lab 10/26/16 2142  NA 140  K 3.3*  CL 100*  CO2 27  GLUCOSE 116*  BUN 30*  CREATININE 1.71*  CALCIUM 9.3   GFR: Estimated Creatinine Clearance: 23 mL/min (by C-G formula based on SCr of 1.71 mg/dL (H)). Liver Function Tests:  Recent Labs Lab 10/26/16 2142  AST 28  ALT 18  ALKPHOS 59  BILITOT 0.6  PROT 7.1  ALBUMIN 4.0   Urine analysis:    Component Value Date/Time   COLORURINE YELLOW 10/26/2016 2213    APPEARANCEUR CLOUDY (A) 10/26/2016 2213   LABSPEC 1.018 10/26/2016 2213   PHURINE 5.0 10/26/2016 2213   GLUCOSEU NEGATIVE 10/26/2016 2213   HGBUR NEGATIVE 10/26/2016 2213   BILIRUBINUR NEGATIVE 10/26/2016 2213   KETONESUR NEGATIVE 10/26/2016 2213   PROTEINUR 100 (A) 10/26/2016 2213   UROBILINOGEN 1.0 12/11/2011 1121   NITRITE POSITIVE (A) 10/26/2016 2213   LEUKOCYTESUR LARGE (A) 10/26/2016 2213   Radiological Exams on Admission: Ct Head Wo Contrast  Result Date: 10/26/2016 CLINICAL DATA:  Right leg weakness. Patient reports vertigo for 1 week. EXAM: CT HEAD WITHOUT CONTRAST TECHNIQUE: Contiguous axial images were obtained from the base of the skull through the vertex without intravenous contrast. COMPARISON:  Head CT 06/28/2010 FINDINGS: Brain: No evidence of acute infarction, hemorrhage, hydrocephalus, extra-axial collection or mass lesion/mass effect. Progressive atrophy and chronic small vessel ischemia from most recent comparison exam 7 years prior. Vascular: Atherosclerosis of skullbase vasculature without hyperdense vessel or abnormal calcification. Skull: Normal. Negative for fracture or focal lesion. Sinuses/Orbits: Mucosal thickening of the ethmoid air cells. Remaining sinuses are clear. Mastoid air cells are well-aerated. Other: None. IMPRESSION: No acute intracranial abnormality. Generalized atrophy and chronic small vessel ischemia. Electronically Signed   By: Jeb Levering M.D.   On: 10/26/2016 23:35   Dg Chest Port 1 View  Result Date: 10/26/2016 CLINICAL DATA:  Right leg weakness EXAM: PORTABLE CHEST 1 VIEW COMPARISON:  Chest radiograph 09/13/2014 FINDINGS: Mild cardiomegaly with atherosclerotic calcification in the aortic arch. Bibasilar atelectasis. No focal airspace consolidation or pulmonary edema. No pneumothorax or sizable pleural effusion. Shallow lung inflation. IMPRESSION: Hypoinflation and bibasilar atelectasis.  Aortic atherosclerosis. Electronically Signed   By: Ulyses Jarred M.D.   On: 10/26/2016 22:19   Dg Knee Complete 4 Views Right  Result Date: 10/26/2016 CLINICAL DATA:  Vertigo. Generalized weakness, greatest in the right lower extremity. EXAM: RIGHT KNEE - COMPLETE 4+ VIEW COMPARISON:  10/26/2014 FINDINGS: The total knee arthroplasty hardware appears intact. No evidence of acute fracture. No dislocation. No acute soft tissue abnormality. IMPRESSION: Negative. Electronically Signed   By: Andreas Newport M.D.   On: 10/26/2016 23:29   Dg Hip Unilat With Pelvis 2-3 Views Right  Result Date: 10/26/2016 CLINICAL DATA:  Weakness,  particularly in the right lower extremity. EXAM: DG HIP (WITH OR WITHOUT PELVIS) 2-3V RIGHT COMPARISON:  None. FINDINGS: Negative for acute fracture or dislocation. No bone lesion or bony destruction. No acute soft tissue abnormality. IMPRESSION: Negative. Electronically Signed   By: Andreas Newport M.D.   On: 10/26/2016 23:32    EKG: Independently reviewed.   Assessment/Plan Principal Problem:   Weakness generalized Active Problems:   HTN (hypertension)   Hyperlipidemia   UTI (urinary tract infection)   Hypothyroidism    PLAN:   Weakness:  I suspect she is deconditioned, and slightly dehydrated, aggravated by her UTI.  Will admit her for IVF, IV antibiotics, PT evaluation, and seeking STR.  Will place on telemetry and cycle troponins.   UTI:  Will Tx with IV Rocephin.    Hypothyrodism:  Continue with supplement.  Check TSH.   AKI on CKD, Probable:  Will follow Cr and see if she improves with gentle IVF.  HTN:  I am concerned about her meds with BB and CCB at high doses.  Will continue BB and half the Verapamil.  D/C Cardura as it can cause orthostatic symptoms.   Disposition:  She is requesting STR.  Will consult social service.  Request PT evaluation for gait asessement.     DVT prophylaxis: SUBQ heparin.  Code Status: FULL CODE.  Family Communication: None.  Disposition Plan:  STR  Consults called: None.   Admission status: Inpatient as I think it will take her over two days to improve given severe weakness, unable to walk and having UTI.    Loralai Eisman MD FACP. [facility]  If 7PM-7AM, please contact night-coverage [email or web address] Password [facility]  10/27/2016, 12:35 AM

## 2016-10-27 NOTE — Progress Notes (Signed)
Pt did not want to answer admission questions at this time. "i'm to tired, just tell them I said that I didn't want to answer any questions". Pt in bed with alarm on, side rails up, call light within reach. Door opened. Nothing needed at this time.

## 2016-10-27 NOTE — Progress Notes (Signed)
Pt does not have IV access. First shift nurse tried to reinsert with no success. Patient is refusing to have another IV placed.

## 2016-10-27 NOTE — Progress Notes (Signed)
I agree with the history and physical as per Dr. Marin Comment  88 ? Known history syncope unknown etiology CK D3 HTN Lower extremity edema First degree AV block Documented history of positional dizziness Prior complex ankle surgery 2009  Admit 10/27/16 weak, inability to ambulate Urinary infection AI up from bun creat 30/1.4-->30/1.7 TSH 40   Pno fever  No chills No dysuria Feels a lil weak Not orthostatic No cp Has been taking Cardura--was not supposed to be on this per prior Cardiology notes Will d/c IV rocephin as I do not think infecte durine Will ambulate with PT Will get [facility] PT/OT  Will monitor on tele overnight Likely am d/c home with hh pt/ot  Verneita Griffes, MD [facility] [phone]

## 2016-10-27 NOTE — Progress Notes (Signed)
Pt stated during admission that an xray was never take of her Lt hip. Will pass on to next shift nurse in hopes of getting an xray taken.

## 2016-10-27 NOTE — Progress Notes (Signed)
Pt wants an xray of her lt hip MD on call notified.

## 2016-10-28 LAB — BASIC METABOLIC PANEL WITH GFR
Anion gap: 8 (ref 5–15)
BUN: 22 mg/dL — ABNORMAL HIGH (ref 6–20)
CO2: 28 mmol/L (ref 22–32)
Calcium: 9.1 mg/dL (ref 8.9–10.3)
Chloride: 102 mmol/L (ref 101–111)
Creatinine, Ser: 1.41 mg/dL — ABNORMAL HIGH (ref 0.44–1.00)
GFR calc Af Amer: 38 mL/min — ABNORMAL LOW
GFR calc non Af Amer: 33 mL/min — ABNORMAL LOW
Glucose, Bld: 103 mg/dL — ABNORMAL HIGH (ref 65–99)
Potassium: 3.8 mmol/L (ref 3.5–5.1)
Sodium: 138 mmol/L (ref 135–145)

## 2016-10-28 LAB — CBC
HCT: 36.5 % (ref 36.0–46.0)
HEMOGLOBIN: 12.1 g/dL (ref 12.0–15.0)
MCH: 34 pg (ref 26.0–34.0)
MCHC: 33.2 g/dL (ref 30.0–36.0)
MCV: 102.5 fL — ABNORMAL HIGH (ref 78.0–100.0)
PLATELETS: 186 10*3/uL (ref 150–400)
RBC: 3.56 MIL/uL — AB (ref 3.87–5.11)
RDW: 14.2 % (ref 11.5–15.5)
WBC: 6 10*3/uL (ref 4.0–10.5)

## 2016-10-28 MED ORDER — LEVOTHYROXINE SODIUM 75 MCG PO TABS
150.0000 ug | ORAL_TABLET | Freq: Every day | ORAL | Status: DC
  Administered 2016-10-29 – 2016-10-30 (×2): 150 ug via ORAL
  Filled 2016-10-28 (×2): qty 2

## 2016-10-28 NOTE — Progress Notes (Signed)
Taylor Giles [id number] DOB: June 30, 1928 DOA: 10/26/2016 PCP: Purvis Kilts, MD  Brief narrative:  50 ? Known history syncope unknown etiology CK D3 HTN Lower extremity edema First degree AV block Documented history of positional dizziness Prior complex ankle surgery 2009  Admit 10/27/16 weak, inability to ambulate Urinary infection AI up from bun creat 30/1.4-->30/1.7 TSH 40  Past medical history-As per Problem list Chart reviewed as below-   Consultants:  none  Procedures:  none  Antibiotics:  none   Subjective   Awake alert pleasant nursing reports poor mobility and scared to walk No cp  No fever  No chills No n/v   Objective    Interim History: nad  Telemetry: sinue   Objective: Vitals:   10/26/16 2354 10/27/16 0138 10/27/16 2142 10/28/16 0608  BP: 158/68 (!) 170/68 (!) 152/53 (!) 157/57  Pulse: 76 71 65 69  Resp: 17 17 17 17   Temp:  97.9 F (36.6 C) 98.6 F (37 C) 97.7 F (36.5 C)  TempSrc:  Oral Oral Oral  SpO2: 93% 100% 94% 93%  Weight:  70.3 kg (154 lb 15.7 oz)    Height:        Intake/Output Summary (Last 24 hours) at 10/28/16 1149 Last data filed at 10/28/16 0000  Gross per 24 hour  Intake              240 ml  Output              200 ml  Net               40 ml    Exam:  General: eomi ncat Cardiovascular: s1 s 2no m/r/g Respiratory: clear no added sound Abdomen:  Soft nt nd no rebound no gaurd Skin intact Neuro intact  Data Reviewed: Basic Metabolic Panel:  Recent Labs Lab 10/26/16 2142 10/28/16 0825  NA 140 138  K 3.3* 3.8  CL 100* 102  CO2 27 28  GLUCOSE 116* 103*  BUN 30* 22*  CREATININE 1.71* 1.41*  CALCIUM 9.3 9.1   Liver Function Tests:  Recent Labs Lab 10/26/16 2142  AST 28  ALT 18  ALKPHOS 59  BILITOT 0.6  PROT 7.1  ALBUMIN 4.0   No results for input(s): LIPASE, AMYLASE in the last 168 hours. No results for input(s): AMMONIA in the last 168 hours. CBC:  Recent Labs Lab  10/26/16 2142 10/28/16 0825  WBC 9.5 6.0  NEUTROABS 7.4  --   HGB 13.1 12.1  HCT 39.7 36.5  MCV 101.8* 102.5*  PLT 208 186   Cardiac Enzymes:  Recent Labs Lab 10/27/16 0157 10/27/16 0824 10/27/16 1318  TROPONINI <0.03 <0.03 <0.03   BNP: Invalid input(s): POCBNP CBG: No results for input(s): GLUCAP in the last 168 hours.  No results found for this or any previous visit (from the past 240 hour(s)).   Studies:              All Imaging reviewed and is as per above notation   Scheduled Meds: . calcium acetate  667 mg Oral TID WC  . cholecalciferol  1,000 Units Oral Daily  . heparin  5,000 Units Subcutaneous Q8H  . levothyroxine  125 mcg Oral QAC breakfast  . magnesium oxide  200 mg Oral QHS  . metoprolol succinate  50 mg Oral Daily  . mirabegron ER  25 mg Oral QHS  . sodium chloride flush  3 mL Intravenous Q12H  . verapamil  120 mg Oral  Daily  . vitamin C  500 mg Oral Daily   Continuous Infusions: . dextrose 5 % and 0.9% NaCl Stopped (10/27/16 1839)     Assessment/Plan:  1. Debility, weakness-PT eval not available overweekend.  Patient max assist at bedsdie-unclear cause.  Await Eval from PT tomorrow and decide on dispo.  Family aware. 2. Hypothyroid-TSH 40.  Adjusted Synthroid to 150 mcg from 125.  Possible but unlikely cause of weakness 3. AKI-creat down from 1.7-->1.4.  Saline d/c 2/4 4. Possible ortho stasis-holding cardura. Orthostatic check was neg  Continue metoprolol 50 daily, verapamil 120 daily 5. Urinary incontinence-cont Mirabegron ER 25 daily   D/w family on phone Await therapy evaluation--Anticipate d/c soon   Verneita Griffes, MD  [facility] Pager [phone] 10/28/2016, 11:49 AM    LOS: 1 day

## 2016-10-29 LAB — BASIC METABOLIC PANEL
Anion gap: 7 (ref 5–15)
BUN: 26 mg/dL — AB (ref 6–20)
CALCIUM: 9.2 mg/dL (ref 8.9–10.3)
CO2: 28 mmol/L (ref 22–32)
Chloride: 102 mmol/L (ref 101–111)
Creatinine, Ser: 1.36 mg/dL — ABNORMAL HIGH (ref 0.44–1.00)
GFR calc Af Amer: 40 mL/min — ABNORMAL LOW (ref >60)
GFR calc non Af Amer: 34 mL/min — ABNORMAL LOW (ref >60)
GLUCOSE: 106 mg/dL — AB (ref 65–99)
POTASSIUM: 4.2 mmol/L (ref 3.5–5.1)
Sodium: 137 mmol/L (ref 135–145)

## 2016-10-29 NOTE — Clinical Social Work Placement (Signed)
   CLINICAL SOCIAL WORK PLACEMENT  NOTE  Date:  10/29/2016  Patient Details  Name: Taylor Giles MRN: [id number] Date of Birth: [birth year]  Clinical Social Work is seeking post-discharge placement for this patient at the [place] level of care (*CSW will initial, date and re-position this form in  chart as items are completed):  Yes   Patient/family provided with [facility] Work Department's list of facilities offering this level of care within the geographic area requested by the patient (or if unable, by the patient's family).  Yes   Patient/family informed of their freedom to choose among providers that offer the needed level of care, that participate in Medicare, Medicaid or managed care program needed by the patient, have an available bed and are willing to accept the patient.  Yes   Patient/family informed of [facility]'s ownership interest in [facility] and [facility], as well as of the fact that they are under no obligation to receive care at these facilities.  PASRR submitted to EDS on       PASRR number received on       Existing PASRR number confirmed on 10/29/16     FL2 transmitted to all facilities in geographic area requested by pt/family on 10/29/16     FL2 transmitted to all facilities within larger geographic area on       Patient informed that his/her managed care company has contracts with or will negotiate with certain facilities, including the following:            Patient/family informed of bed offers received.  Patient chooses bed at       Physician recommends and patient chooses bed at      Patient to be transferred to   on  .  Patient to be transferred to facility by       Patient family notified on   of transfer.  Name of family member notified:        PHYSICIAN       Additional Comment:    _______________________________________________ Salome Arnt, LCSW 10/29/2016, 2:39  PM [phone]

## 2016-10-29 NOTE — Clinical Social Work Note (Signed)
Clinical Social Work Assessment  Patient Details  Name: Taylor Giles MRN: [id number] Date of Birth: March 03, 1928  Date of referral:  10/29/16               Reason for consult:  Discharge Planning                Permission sought to share information with:    Permission granted to share information::     Name::        Agency::     Relationship::     Contact Information:     Housing/Transportation Living arrangements for the past 2 months:  Single Family Home Source of Information:  Patient Patient Interpreter Needed:  None Criminal Activity/Legal Involvement Pertinent to Current Situation/Hospitalization:  No - Comment as needed Significant Relationships:  Friend Lives with:  Self Do you feel safe going back to the place where you live?  Yes Need for family participation in patient care:  No (Coment)  Care giving concerns:  Pt lives alone and is not at baseline.    Social Worker assessment / plan:  CSW met with pt at bedside. Pt alert and oriented and reports she lives alone. Pt generally is independent and still drives. She has had some weakness recently and has been using a walker. Pt's family is out of town. She has several friends who check on her some. PT evaluated pt and recommend SNF. CSW discussed placement process, including [employer] authorization. Pt states she was at [facility] several years ago. She requests to stay in [place]. Will initiate bed search.   Employment status:  Retired Nurse, adult PT Recommendations:  [place] / Referral to community resources:  [place]  Patient/Family's Response to care:  Pt requests short term rehab prior to return home.   Patient/Family's Understanding of and Emotional Response to Diagnosis, Current Treatment, and Prognosis:  Pt is aware of admission diagnosis and treatment plan.   Emotional Assessment Appearance:  Appears stated age Attitude/Demeanor/Rapport:   Other (Pleasant) Affect (typically observed):  Accepting Orientation:  Oriented to Self, Oriented to Place, Oriented to Situation, Oriented to  Time Alcohol / Substance use:  Not Applicable Psych involvement (Current and /or in the community):  No (Comment)  Discharge Needs  Concerns to be addressed:  Discharge Planning Concerns Readmission within the last 30 days:  No Current discharge risk:  Lives alone, Physical Impairment Barriers to Discharge:  Continued Medical Work up   Salome Arnt, [place] 10/29/2016, 2:42 PM [phone]

## 2016-10-29 NOTE — Progress Notes (Signed)
Physical Therapy Treatment Patient Details Name: Taylor Giles MRN: [id number] DOB: 10-14-1927 Today's Date: 10/29/2016    History of Present Illness Taylor Giles is an 83 y.o. female with hx of bilateral knee arthritis, hx of MVP, HTN, HLD, hypothyroidism, just came back to her home from staying at her daughter's house, felt weak, unable to ambulate.  The neighbors said they had to give her full assist to stand her up.  Work up in the ER included a negative head CT, Cr of 1.7 with K of 3.3, and normal WBC and HB.  UA showed TNTC WBCs, and hospitalist was asked to admit her for unable to walk, living alone, seeking STR, and UTI.     PT Comments    Patient received sitting up at EOB with RN just leaving room; patient pleasant and willing to participate in skilled PT services today. Performed exercises at EOB (see list later in note), followed by sit to stand with Mod(A) and walker. Patient continues to have difficulty with functional transfers, requiring Mod(A) and actually having difficulty in maintaining standing on first attempt due to apparent weakness/fatigue- "I just couldn't hold myself up". Able to ambulate approximately 20f with walker, supervision, cues for safety with device however very fatigued at the end of gait distance. Patient will benefit from and is agreeable to SNF before returning to being home alone/needing to be independent. MD aware of PT recommendations. Patient left sitting at EOB with bed alarm activated, MD in room, all needs otherwise met.   Follow Up Recommendations  SNF     Equipment Recommendations  None recommended by PT    Recommendations for Other Services       Precautions / Restrictions      Mobility  Bed Mobility               General bed mobility comments: DNT today   Transfers Overall transfer level: Needs assistance Equipment used: Rolling walker (2 wheeled) Transfers: Sit to/from Stand Sit to Stand: Mod assist             Ambulation/Gait Ambulation/Gait assistance: Supervision Ambulation Distance (Feet): 50 Feet Assistive device: Rolling walker (2 wheeled) Gait Pattern/deviations: Decreased step length - right;Decreased step length - left   Gait velocity interpretation: <1.8 ft/sec, indicative of risk for recurrent falls General Gait Details: very fatigued at end of gait distance; cues for safe use of walker    Stairs            Wheelchair Mobility    Modified Rankin (Stroke Patients Only)       Balance Overall balance assessment: No apparent balance deficits (not formally assessed)                                  Cognition Arousal/Alertness: Awake/alert Behavior During Therapy: WFL for tasks assessed/performed Overall Cognitive Status: Within Functional Limits for tasks assessed                      Exercises General Exercises - Lower Extremity Ankle Circles/Pumps: Both;15 reps;Seated Gluteal Sets: Both;10 reps Long Arc Quad: Both;10 reps Hip ABduction/ADduction: Both;10 reps Hip Flexion/Marching: Both;10 reps    General Comments        Pertinent Vitals/Pain Pain Assessment: No/denies pain    Home Living                      Prior  Function            PT Goals (current goals can now be found in the care plan section) Acute Rehab PT Goals PT Goal Formulation: With patient Time For Goal Achievement: 11/10/16 Potential to Achieve Goals: Good Progress towards PT goals: Progressing toward goals    Frequency    Min 3X/week      PT Plan Current plan remains appropriate    Co-evaluation             End of Session Equipment Utilized During Treatment: Gait belt Activity Tolerance: Patient tolerated treatment well Patient left: in bed;Other (comment);with call bell/phone within reach;with bed alarm set (sitting at EOB )     Time: 0350-0938 PT Time Calculation (min) (ACUTE ONLY): 20 min  Charges:  $Therapeutic Activity:  8-22 mins                    G Codes:      Deniece Ree PT, DPT [phone]

## 2016-10-29 NOTE — NC FL2 (Signed)
[place] LEVEL OF CARE SCREENING TOOL     IDENTIFICATION  Patient Name: Taylor Giles Birthdate: May 31, 1928 Sex: female Admission Date (Current Location): 10/26/2016  [facility] and [place] Number:  [employer] and Address:  [place] [address], [place]      Provider Number: [id number]  Attending Physician Name and Address:  Nita Sells, MD  Relative Name and Phone Number:       Current Level of Care: Hospital Recommended Level of Care: [place] Prior Approval Number:    Date Approved/Denied:   PASRR Number: [id number]  Discharge Plan: SNF    Current Diagnoses: Patient Active Problem List   Diagnosis Date Noted  . Weakness generalized 10/27/2016  . UTI (urinary tract infection) 10/27/2016  . Hypothyroidism 10/27/2016  . Bradycardia 10/05/2013  . Syncope 02/26/2013  . HTN (hypertension) 02/26/2013  . Hyperlipidemia 02/26/2013  . Cystocele 12/11/2011  . NONSPECIFIC ABN FINDING RAD & OTH EXAM GI TRACT 12/20/2009    Orientation RESPIRATION BLADDER Height & Weight     Self, Situation, Place, Time  Normal Continent Weight: 154 lb 15.7 oz (70.3 kg) Height:  5\' 7"  (170.2 cm)  BEHAVIORAL SYMPTOMS/MOOD NEUROLOGICAL BOWEL NUTRITION STATUS  Other (Comment) (none)  (n/a) Continent Diet (Heart healthy/carb modified)  AMBULATORY STATUS COMMUNICATION OF NEEDS Skin   Limited Assist Verbally Normal                       Personal Care Assistance Level of Assistance  Bathing, Feeding, Dressing Bathing Assistance: Limited assistance Feeding assistance: Limited assistance Dressing Assistance: Limited assistance     Functional Limitations Info  Sight, Hearing, Speech Sight Info: Adequate Hearing Info: Adequate Speech Info: Adequate    SPECIAL CARE FACTORS FREQUENCY  PT (By licensed PT)     PT Frequency: 5              Contractures Contractures Info: Not present    Additional  Factors Info  Code Status, Allergies Code Status Info: Full code Allergies Info: Ciprofloxacin, Gabapentin, Nsaids, Nitrofurantoin, Terbinafine Hcl           Current Medications (10/29/2016):  This is the current hospital active medication list Current Facility-Administered Medications  Medication Dose Route Frequency Provider Last Rate Last Dose  . acetaminophen (TYLENOL) tablet 325 mg  325 mg Oral Q4H PRN Orvan Falconer, MD      . calcium acetate (PHOSLO) capsule 667 mg  667 mg Oral TID WC Orvan Falconer, MD   667 mg at 10/29/16 1400  . cholecalciferol (VITAMIN D) tablet 1,000 Units  1,000 Units Oral Daily Orvan Falconer, MD   1,000 Units at 10/29/16 1022  . dextrose 5 %-0.9 % sodium chloride infusion   Intravenous Continuous Orvan Falconer, MD   Stopped at 10/27/16 1839  . guaiFENesin (ROBITUSSIN) 100 MG/5ML solution 100 mg  5 mL Oral Q4H PRN Orvan Falconer, MD   100 mg at 10/27/16 0222  . heparin injection 5,000 Units  5,000 Units Subcutaneous Q8H Orvan Falconer, MD   5,000 Units at 10/29/16 1024  . levothyroxine (SYNTHROID, LEVOTHROID) tablet 150 mcg  150 mcg Oral QAC breakfast Nita Sells, MD   150 mcg at 10/29/16 0805  . magnesium oxide (MAG-OX) tablet 200 mg  200 mg Oral QHS Nita Sells, MD   200 mg at 10/28/16 2243  . metoprolol succinate (TOPROL-XL) 24 hr tablet 50 mg  50 mg Oral Daily Orvan Falconer, MD  50 mg at 10/29/16 1023  . mirabegron ER (MYRBETRIQ) tablet 25 mg  25 mg Oral QHS Orvan Falconer, MD   25 mg at 10/28/16 2244  . sodium chloride flush (NS) 0.9 % injection 3 mL  3 mL Intravenous Q12H Orvan Falconer, MD   3 mL at 10/28/16 2245  . verapamil (CALAN-SR) CR tablet 120 mg  120 mg Oral Daily Orvan Falconer, MD   120 mg at 10/29/16 1023  . vitamin C (ASCORBIC ACID) tablet 500 mg  500 mg Oral Daily Orvan Falconer, MD   500 mg at 10/29/16 1022     Discharge Medications: Please see discharge summary for a list of discharge medications.  Relevant Imaging Results:  Relevant Lab Results:   Additional Information SSN:  [id number]  Salome Arnt, [place]

## 2016-10-29 NOTE — Progress Notes (Signed)
AMALA LAKATOS [id number] DOB: [birth year] DOA: 10/26/2016 PCP: Purvis Kilts, MD  Brief narrative:  19 ? Known history syncope unknown etiology CK D3 HTN Lower extremity edema First degree AV block Documented history of positional dizziness Prior complex ankle surgery 2009  Admit 10/27/16 weak, inability to ambulate Urinary infection  bun creat 30/1.4-->30/1.7 TSH 40  Past medical history-As per Problem list Chart reviewed as below-   Consultants:  none  Procedures:  none  Antibiotics:  none   Subjective   Therapy reports still weak and unable to get up independently from bedside She overall feels about the same without significant improvement   Objective    Interim History: nad  Telemetry: sinue   Objective: Vitals:   10/28/16 0608 10/28/16 1300 10/28/16 2111 10/29/16 0510  BP: (!) 157/57 131/68 (!) 158/96 (!) 168/49  Pulse: 69 (!) 57 61 70  Resp: 17 18 18 18   Temp: 97.7 F (36.5 C) 98.2 F (36.8 C) 97.7 F (36.5 C) 97.5 F (36.4 C)  TempSrc: Oral Oral Oral Oral  SpO2: 93% 99% 98% 98%  Weight:      Height:        Intake/Output Summary (Last 24 hours) at 10/29/16 1452 Last data filed at 10/29/16 0400  Gross per 24 hour  Intake              240 ml  Output              700 ml  Net             -460 ml    Exam:  General: eomi ncat Cardiovascular: s1 s 2no m/r/g Respiratory: clear no added sound Abdomen:  Soft nt nd no rebound no gaurd Skin intact Neuro intact  Data Reviewed: Basic Metabolic Panel:  Recent Labs Lab 10/26/16 2142 10/28/16 0825 10/29/16 1239  NA 140 138 137  K 3.3* 3.8 4.2  CL 100* 102 102  CO2 27 28 28   GLUCOSE 116* 103* 106*  BUN 30* 22* 26*  CREATININE 1.71* 1.41* 1.36*  CALCIUM 9.3 9.1 9.2   Liver Function Tests:  Recent Labs Lab 10/26/16 2142  AST 28  ALT 18  ALKPHOS 59  BILITOT 0.6  PROT 7.1  ALBUMIN 4.0   No results for input(s): LIPASE, AMYLASE in the last 168 hours. No results  for input(s): AMMONIA in the last 168 hours. CBC:  Recent Labs Lab 10/26/16 2142 10/28/16 0825  WBC 9.5 6.0  NEUTROABS 7.4  --   HGB 13.1 12.1  HCT 39.7 36.5  MCV 101.8* 102.5*  PLT 208 186   Cardiac Enzymes:  Recent Labs Lab 10/27/16 0157 10/27/16 0824 10/27/16 1318  TROPONINI <0.03 <0.03 <0.03   BNP: Invalid input(s): POCBNP CBG: No results for input(s): GLUCAP in the last 168 hours.  No results found for this or any previous visit (from the past 240 hour(s)).   Studies:              All Imaging reviewed and is as per above notation   Scheduled Meds: . calcium acetate  667 mg Oral TID WC  . cholecalciferol  1,000 Units Oral Daily  . heparin  5,000 Units Subcutaneous Q8H  . levothyroxine  150 mcg Oral QAC breakfast  . magnesium oxide  200 mg Oral QHS  . metoprolol succinate  50 mg Oral Daily  . mirabegron ER  25 mg Oral QHS  . sodium chloride flush  3 mL Intravenous Q12H  .  verapamil  120 mg Oral Daily  . vitamin C  500 mg Oral Daily   Continuous Infusions: . dextrose 5 % and 0.9% NaCl Stopped (10/27/16 1839)     Assessment/Plan:  1. Debility, weakness-She will benefit from skilled nursing placement and we will attempt to the same over the next 1-2 days 2. Hypothyroid-TSH 40.  Adjusted Synthroid to 150 mcg from 125.  Possible but unlikely cause of weakness 3. AKI-creat down from 1.7-->1.4---> 0.3.  Saline d/c 2/4 4. Possible ortho stasis-holding cardura. Orthostatic check was neg  Continue metoprolol 50 daily, verapamil 120 daily 5. Urinary incontinence-cont Mirabegron ER 25 daily   D/w family on phone Can discharge facility potentially 10/30/16   Verneita Griffes, MD  [facility] Pager [phone] 10/29/2016, 2:52 PM    LOS: 2 days

## 2016-10-30 MED ORDER — METOPROLOL SUCCINATE ER 50 MG PO TB24: 50.0000 mg | ORAL_TABLET | Freq: Every day | ORAL | Status: DC

## 2016-10-30 MED ORDER — HYDROCODONE-ACETAMINOPHEN 5-325 MG PO TABS: 1.0000 | ORAL_TABLET | ORAL | 0 refills | Status: DC | PRN

## 2016-10-30 MED ORDER — LEVOTHYROXINE SODIUM 150 MCG PO TABS: 150.0000 ug | ORAL_TABLET | Freq: Every day | ORAL | Status: AC

## 2016-10-30 MED ORDER — VERAPAMIL HCL ER 120 MG PO TBCR: 120.0000 mg | EXTENDED_RELEASE_TABLET | Freq: Every day | ORAL | Status: DC

## 2016-10-30 NOTE — Care Management Note (Signed)
Case Management Note  Patient Details  Name: Taylor Giles MRN: [id number] Date of Birth: 1927/12/14     Expected Discharge Date:  10/30/16               Expected Discharge Plan:  [place]  In-House Referral:  Clinical Social Work  Discharge planning Services  CM Consult  Post Acute Care Choice:  Home Health Choice offered to:  Patient  DME Arranged:    DME Agency:     HH Arranged:  RN, PT [place] Agency:  [place]  Status of Service:  Completed, signed off  If discussed at [place] of Stay Meetings, dates discussed:    Additional Comments: Patient discharging home today not SNF. She has asked for [facility] for [facility] services. Romualdo Bolk of [facility] notified and will obtain orders from chart. No other CM needs known.   Taylor Giles, [place], RN 10/30/2016, 12:18 PM

## 2016-10-30 NOTE — Progress Notes (Signed)
Patient discharged home with instructions given on medications,and follow up visits,patient verbalized understanding. Prescriptions sent with patient. No c/o pain or discomfort noted. Staff to accompany patient to an awaiting vehicle.

## 2016-10-30 NOTE — Clinical Social Work Note (Signed)
CSW spoke with pt about d/c plans again. Authorization not received from [facility] reports they have not requested clinicals yet. Pt is aware and does not want to pay privately. She states she will be just fine at home. Her daughter lives in [facility] and she could go live with her for awhile, but doesn't want to. She is agreeable to home health. CM and MD notified. CSW will speak with [facility] about taking pt from home if authorization is received. Discussed with supervisor.   Benay Pike, [place]

## 2016-10-30 NOTE — Discharge Summary (Signed)
Physician Discharge Summary  Taylor Giles [id number] DOB: 1928-07-04 DOA: 10/26/2016  PCP: Purvis Kilts, MD  Admit date: 10/26/2016 Discharge date: 10/30/2016  Time spent: 40 minutes  Recommendations for Outpatient Follow-up:  1. Patient will need placement at skilled facility on discharge 2. Would recommend holding Cardura and cutting back doses of metoprolol and verapamil as has been done during hospital stay 3. Have held her prior to admission HCTZ 4. Will need another TSH in about 3 weeks as an adjustment was made this hospitalization because her TSH was 40 5. Might require discussion regarding impaired glucose tolerance she had high normal blood glucose readings in the 100-115 range  Discharge Diagnoses:  Principal Problem:   Weakness generalized Active Problems:   HTN (hypertension)   Hyperlipidemia   UTI (urinary tract infection)   Hypothyroidism   Discharge Condition: Improved  Diet recommendation: Heart healthy  Filed Weights   10/26/16 2034 10/27/16 0138 10/30/16 0235  Weight: 68 kg (150 lb) 70.3 kg (154 lb 15.7 oz) 70.3 kg (154 lb 15 oz)    History of present illness:  65 ? Known history syncope unknown etiology CK D3 HTN Lower extremity edema First degree AV block Documented history of positional dizziness Prior complex ankle surgery 2009  Admit 10/27/16 weak, inability to ambulate Urinary infection  bun creat 30/1.4-->30/1.7 TSH 40  Hospital Course:    1. Debility, weakness-She will benefit from skilled nursing placement as she was not able to ambulate without assistance during hospital stay. Possible etiology to her weakness could've been hypothyroidism but probably was just weakness in her legs from debility secondary to poor conditioning 2. Hypothyroid-TSH 40.  Adjusted Synthroid to 150 mcg from 125.  Possible but unlikely cause of weakness 3. AKI-creat down from 1.7-->1.4---> 1.3.  Saline d/c 2/4 4. Possible ortho stasis-holding  cardura--she has been recommended to be off of this in the past. Orthostatic check was neg   on admission her 100 mg dose of was adjusted downwards to metoprolol 50 daily, and the 240 of was adjusted downwards to verapamil 120 daily 5. Urinary incontinence-cont Mirabegron ER 25 daily    Discharge Exam: Vitals:   10/30/16 0235 10/30/16 0328  BP: (!) 187/82 (!) 156/52  Pulse: 63 62  Resp: 18   Temp: 97.7 F (36.5 C)     General: Alert very pleasant oriented motivated over around Cardiovascular: S1-S2 no murmur rub or gallop Respiratory: Clinically clear no added sound No lower extremity overall weakness and reflexes are normal  Discharge Instructions    Current Discharge Medication List    CONTINUE these medications which have CHANGED   Details  levothyroxine (SYNTHROID, LEVOTHROID) 150 MCG tablet Take 1 tablet (150 mcg total) by mouth daily before breakfast.    metoprolol succinate (TOPROL-XL) 50 MG 24 hr tablet Take 1 tablet (50 mg total) by mouth daily. Take with or immediately following a meal.    verapamil (CALAN-SR) 120 MG CR tablet Take 1 tablet (120 mg total) by mouth daily.      CONTINUE these medications which have NOT CHANGED   Details  acetaminophen (TYLENOL) 325 MG tablet Take 1,300 mg by mouth every morning. And as needed    Calcium Acetate 667 MG TABS Take 667 mg by mouth 3 (three) times daily.    Cholecalciferol (VITAMIN D-3) 1000 UNITS CAPS Take 1,000 Units by mouth daily.    HYDROcodone-acetaminophen (NORCO/VICODIN) 5-325 MG per tablet Take 1 tablet by mouth every 4 (four) hours as needed for moderate pain.  Magnesium 250 MG TABS Take 250 mg by mouth at bedtime.    MYRBETRIQ 25 MG TB24 tablet Take 25 mg by mouth at bedtime.    vitamin C (ASCORBIC ACID) 500 MG tablet Take 500 mg by mouth daily.      STOP taking these medications     doxazosin (CARDURA) 1 MG tablet      hydrochlorothiazide (HYDRODIURIL) 25 MG tablet        Allergies   Allergen Reactions  . Ciprofloxacin     REACTION: itchy rash  . Gabapentin   . Nsaids     Nephrologist ordered not to take d/t chronic kidney disease  . Nitrofurantoin Rash    macrobid  . Terbinafine Hcl Rash      The results of significant diagnostics from this hospitalization (including imaging, microbiology, ancillary and laboratory) are listed below for reference.    Significant Diagnostic Studies: Ct Head Wo Contrast  Result Date: 10/26/2016 CLINICAL DATA:  Right leg weakness. Patient reports vertigo for 1 week. EXAM: CT HEAD WITHOUT CONTRAST TECHNIQUE: Contiguous axial images were obtained from the base of the skull through the vertex without intravenous contrast. COMPARISON:  Head CT 06/28/2010 FINDINGS: Brain: No evidence of acute infarction, hemorrhage, hydrocephalus, extra-axial collection or mass lesion/mass effect. Progressive atrophy and chronic small vessel ischemia from most recent comparison exam 7 years prior. Vascular: Atherosclerosis of skullbase vasculature without hyperdense vessel or abnormal calcification. Skull: Normal. Negative for fracture or focal lesion. Sinuses/Orbits: Mucosal thickening of the ethmoid air cells. Remaining sinuses are clear. Mastoid air cells are well-aerated. Other: None. IMPRESSION: No acute intracranial abnormality. Generalized atrophy and chronic small vessel ischemia. Electronically Signed   By: Jeb Levering M.D.   On: 10/26/2016 23:35   Dg Chest Port 1 View  Result Date: 10/26/2016 CLINICAL DATA:  Right leg weakness EXAM: PORTABLE CHEST 1 VIEW COMPARISON:  Chest radiograph 09/13/2014 FINDINGS: Mild cardiomegaly with atherosclerotic calcification in the aortic arch. Bibasilar atelectasis. No focal airspace consolidation or pulmonary edema. No pneumothorax or sizable pleural effusion. Shallow lung inflation. IMPRESSION: Hypoinflation and bibasilar atelectasis.  Aortic atherosclerosis. Electronically Signed   By: Ulyses Jarred M.D.   On:  10/26/2016 22:19   Dg Knee Complete 4 Views Right  Result Date: 10/26/2016 CLINICAL DATA:  Vertigo. Generalized weakness, greatest in the right lower extremity. EXAM: RIGHT KNEE - COMPLETE 4+ VIEW COMPARISON:  10/26/2014 FINDINGS: The total knee arthroplasty hardware appears intact. No evidence of acute fracture. No dislocation. No acute soft tissue abnormality. IMPRESSION: Negative. Electronically Signed   By: Andreas Newport M.D.   On: 10/26/2016 23:29   Dg Hip Unilat With Pelvis 2-3 Views Right  Result Date: 10/26/2016 CLINICAL DATA:  Weakness, particularly in the right lower extremity. EXAM: DG HIP (WITH OR WITHOUT PELVIS) 2-3V RIGHT COMPARISON:  None. FINDINGS: Negative for acute fracture or dislocation. No bone lesion or bony destruction. No acute soft tissue abnormality. IMPRESSION: Negative. Electronically Signed   By: Andreas Newport M.D.   On: 10/26/2016 23:32   Dg Hip Unilat With Pelvis Min 4 Views Left  Result Date: 10/27/2016 CLINICAL DATA:  Left hip pain for 1 week.  No known injury. EXAM: DG HIP (WITH OR WITHOUT PELVIS) 4+V LEFT COMPARISON:  Pelvic and right hip radiographs 10/26/2016. FINDINGS: The bones appear mildly demineralized. No evidence of acute fracture, dislocation or femoral head avascular necrosis. The hip joint spaces are maintained. There is a convex left lumbar scoliosis with associated spondylosis. 10 mm calcification in the right mid  abdomen, not previously seen. IMPRESSION: Negative hip radiographs. Indeterminate right mid abdominal calcification. Electronically Signed   By: Richardean Sale M.D.   On: 10/27/2016 11:35    Microbiology: No results found for this or any previous visit (from the past 240 hour(s)).   Labs: Basic Metabolic Panel:  Recent Labs Lab 10/26/16 2142 10/28/16 0825 10/29/16 1239  NA 140 138 137  K 3.3* 3.8 4.2  CL 100* 102 102  CO2 27 28 28   GLUCOSE 116* 103* 106*  BUN 30* 22* 26*  CREATININE 1.71* 1.41* 1.36*  CALCIUM 9.3 9.1  9.2   Liver Function Tests:  Recent Labs Lab 10/26/16 2142  AST 28  ALT 18  ALKPHOS 59  BILITOT 0.6  PROT 7.1  ALBUMIN 4.0   No results for input(s): LIPASE, AMYLASE in the last 168 hours. No results for input(s): AMMONIA in the last 168 hours. CBC:  Recent Labs Lab 10/26/16 2142 10/28/16 0825  WBC 9.5 6.0  NEUTROABS 7.4  --   HGB 13.1 12.1  HCT 39.7 36.5  MCV 101.8* 102.5*  PLT 208 186   Cardiac Enzymes:  Recent Labs Lab 10/27/16 0157 10/27/16 0824 10/27/16 1318  TROPONINI <0.03 <0.03 <0.03   BNP: BNP (last 3 results) No results for input(s): BNP in the last 8760 hours.  ProBNP (last 3 results) No results for input(s): PROBNP in the last 8760 hours.  CBG: No results for input(s): GLUCAP in the last 168 hours.     SignedNita Sells MD   [facility] 10/30/2016, 9:48 AM

## 2016-11-01 DIAGNOSIS — N390 Urinary tract infection, site not specified: Secondary | ICD-10-CM | POA: Diagnosis not present

## 2016-11-01 DIAGNOSIS — M6281 Muscle weakness (generalized): Secondary | ICD-10-CM | POA: Diagnosis not present

## 2016-11-01 DIAGNOSIS — N183 Chronic kidney disease, stage 3 (moderate): Secondary | ICD-10-CM | POA: Diagnosis not present

## 2016-11-01 DIAGNOSIS — R600 Localized edema: Secondary | ICD-10-CM | POA: Diagnosis not present

## 2016-11-01 DIAGNOSIS — E785 Hyperlipidemia, unspecified: Secondary | ICD-10-CM | POA: Diagnosis not present

## 2016-11-01 DIAGNOSIS — R32 Unspecified urinary incontinence: Secondary | ICD-10-CM | POA: Diagnosis not present

## 2016-11-01 DIAGNOSIS — E039 Hypothyroidism, unspecified: Secondary | ICD-10-CM | POA: Diagnosis not present

## 2016-11-01 DIAGNOSIS — I129 Hypertensive chronic kidney disease with stage 1 through stage 4 chronic kidney disease, or unspecified chronic kidney disease: Secondary | ICD-10-CM | POA: Diagnosis not present

## 2016-11-02 DIAGNOSIS — D510 Vitamin B12 deficiency anemia due to intrinsic factor deficiency: Secondary | ICD-10-CM | POA: Diagnosis not present

## 2016-11-02 DIAGNOSIS — E039 Hypothyroidism, unspecified: Secondary | ICD-10-CM | POA: Diagnosis not present

## 2016-11-02 DIAGNOSIS — N289 Disorder of kidney and ureter, unspecified: Secondary | ICD-10-CM | POA: Diagnosis not present

## 2016-11-02 DIAGNOSIS — Z6824 Body mass index (BMI) 24.0-24.9, adult: Secondary | ICD-10-CM | POA: Diagnosis not present

## 2016-11-02 DIAGNOSIS — E860 Dehydration: Secondary | ICD-10-CM | POA: Diagnosis not present

## 2016-11-02 DIAGNOSIS — Z1389 Encounter for screening for other disorder: Secondary | ICD-10-CM | POA: Diagnosis not present

## 2016-11-02 DIAGNOSIS — R7309 Other abnormal glucose: Secondary | ICD-10-CM | POA: Diagnosis not present

## 2016-11-06 ENCOUNTER — Observation Stay (HOSPITAL_COMMUNITY)
Admission: EM | Admit: 2016-11-06 | Discharge: 2016-11-07 | Disposition: A | Discharge status: Home / Self Care | Payer: Medicare HMO | Attending: Family Medicine | Admitting: Family Medicine

## 2016-11-06 ENCOUNTER — Encounter (HOSPITAL_COMMUNITY): Payer: Self-pay

## 2016-11-06 DIAGNOSIS — E785 Hyperlipidemia, unspecified: Secondary | ICD-10-CM | POA: Diagnosis present

## 2016-11-06 DIAGNOSIS — E039 Hypothyroidism, unspecified: Secondary | ICD-10-CM | POA: Diagnosis not present

## 2016-11-06 DIAGNOSIS — Z79899 Other long term (current) drug therapy: Secondary | ICD-10-CM | POA: Insufficient documentation

## 2016-11-06 DIAGNOSIS — Z87891 Personal history of nicotine dependence: Secondary | ICD-10-CM | POA: Diagnosis not present

## 2016-11-06 DIAGNOSIS — I129 Hypertensive chronic kidney disease with stage 1 through stage 4 chronic kidney disease, or unspecified chronic kidney disease: Secondary | ICD-10-CM | POA: Insufficient documentation

## 2016-11-06 DIAGNOSIS — N390 Urinary tract infection, site not specified: Secondary | ICD-10-CM | POA: Diagnosis not present

## 2016-11-06 DIAGNOSIS — R32 Unspecified urinary incontinence: Secondary | ICD-10-CM | POA: Diagnosis not present

## 2016-11-06 DIAGNOSIS — R404 Transient alteration of awareness: Secondary | ICD-10-CM | POA: Diagnosis not present

## 2016-11-06 DIAGNOSIS — N183 Chronic kidney disease, stage 3 (moderate): Secondary | ICD-10-CM | POA: Diagnosis not present

## 2016-11-06 DIAGNOSIS — I10 Essential (primary) hypertension: Secondary | ICD-10-CM | POA: Diagnosis present

## 2016-11-06 DIAGNOSIS — R42 Dizziness and giddiness: Principal | ICD-10-CM | POA: Insufficient documentation

## 2016-11-06 DIAGNOSIS — R600 Localized edema: Secondary | ICD-10-CM | POA: Diagnosis not present

## 2016-11-06 DIAGNOSIS — N189 Chronic kidney disease, unspecified: Secondary | ICD-10-CM | POA: Diagnosis not present

## 2016-11-06 DIAGNOSIS — M6281 Muscle weakness (generalized): Secondary | ICD-10-CM | POA: Diagnosis not present

## 2016-11-06 DIAGNOSIS — R2681 Unsteadiness on feet: Secondary | ICD-10-CM

## 2016-11-06 LAB — CBC WITH DIFFERENTIAL/PLATELET
BASOS ABS: 0 10*3/uL (ref 0.0–0.1)
BASOS PCT: 0 %
Eosinophils Absolute: 0.1 10*3/uL (ref 0.0–0.7)
Eosinophils Relative: 1 %
HEMATOCRIT: 34.7 % — AB (ref 36.0–46.0)
HEMOGLOBIN: 11.7 g/dL — AB (ref 12.0–15.0)
LYMPHS PCT: 16 %
Lymphs Abs: 1.5 10*3/uL (ref 0.7–4.0)
MCH: 34.3 pg — ABNORMAL HIGH (ref 26.0–34.0)
MCHC: 33.7 g/dL (ref 30.0–36.0)
MCV: 101.8 fL — AB (ref 78.0–100.0)
MONO ABS: 0.9 10*3/uL (ref 0.1–1.0)
Monocytes Relative: 9 %
NEUTROS ABS: 6.9 10*3/uL (ref 1.7–7.7)
NEUTROS PCT: 74 %
Platelets: 247 10*3/uL (ref 150–400)
RBC: 3.41 MIL/uL — AB (ref 3.87–5.11)
RDW: 14 % (ref 11.5–15.5)
WBC: 9.4 10*3/uL (ref 4.0–10.5)

## 2016-11-06 LAB — BASIC METABOLIC PANEL
ANION GAP: 9 (ref 5–15)
BUN: 23 mg/dL — ABNORMAL HIGH (ref 6–20)
CALCIUM: 9.1 mg/dL (ref 8.9–10.3)
CO2: 24 mmol/L (ref 22–32)
Chloride: 106 mmol/L (ref 101–111)
Creatinine, Ser: 1.65 mg/dL — ABNORMAL HIGH (ref 0.44–1.00)
GFR calc non Af Amer: 27 mL/min — ABNORMAL LOW (ref >60)
GFR, EST AFRICAN AMERICAN: 31 mL/min — AB (ref >60)
GLUCOSE: 102 mg/dL — AB (ref 65–99)
POTASSIUM: 4 mmol/L (ref 3.5–5.1)
Sodium: 139 mmol/L (ref 135–145)

## 2016-11-06 LAB — URINALYSIS, ROUTINE W REFLEX MICROSCOPIC
BACTERIA UA: NONE SEEN
BILIRUBIN URINE: NEGATIVE
GLUCOSE, UA: NEGATIVE mg/dL
Hgb urine dipstick: NEGATIVE
KETONES UR: NEGATIVE mg/dL
NITRITE: NEGATIVE
PH: 6 (ref 5.0–8.0)
PROTEIN: 30 mg/dL — AB
Specific Gravity, Urine: 1.008 (ref 1.005–1.030)

## 2016-11-06 MED ORDER — SODIUM CHLORIDE 0.9 % IV BOLUS (SEPSIS)
500.0000 mL | Freq: Once | INTRAVENOUS | Status: AC
  Administered 2016-11-06: 500 mL via INTRAVENOUS

## 2016-11-06 MED ORDER — DIAZEPAM 2 MG PO TABS
2.0000 mg | ORAL_TABLET | Freq: Once | ORAL | Status: AC
  Administered 2016-11-06: 2 mg via ORAL
  Filled 2016-11-06: qty 1

## 2016-11-06 NOTE — ED Provider Notes (Signed)
[facility] DEPT Provider Note   CSN: [id number] Arrival date & time: 11/06/16  1730     History   Chief Complaint Chief Complaint  Patient presents with  . Dizziness    HPI Taylor Giles is a 83 y.o. female.  The history is provided by the patient.  Dizziness  Quality:  Imbalance Severity:  Severe Onset quality:  Gradual Duration:  2 weeks Timing:  Constant Progression:  Worsening Chronicity:  Recurrent Context: head movement   Relieved by:  Nothing Worsened by:  Movement Associated symptoms: no blood in stool, no chest pain, no headaches, no nausea, no shortness of breath, no vomiting and no weakness   Risk factors: hx of vertigo    No relief with meclizine.   Pt was recently admitted for generalized weakness and UTI. Reports symptoms (vertigo) has been on going since. Denies fall or trauma.   She was taken on her diuretic, but reports that she does not hydrate well as she does not like to get up in the middle of the night to void because she needs help from neighbors to do so. Pt lives alone at home and currently working on being placed at a sNF.   Past Medical History:  Diagnosis Date  . Anemia   . Arthritis   . Chronic kidney disease    due to use of votaren, sees Dr. Lowanda Foster  . Gall stones   . Hyperlipidemia   . Hypertension   . Hypothyroidism   . Melanoma ([place])    left elbow  s/p excision 1982  . Mitral valve prolapse   . Osteopenia   . PONV (postoperative nausea and vomiting)   . Syncope     Patient Active Problem List   Diagnosis Date Noted  . Weakness generalized 10/27/2016  . UTI (urinary tract infection) 10/27/2016  . Hypothyroidism 10/27/2016  . Bradycardia 10/05/2013  . Syncope 02/26/2013  . HTN (hypertension) 02/26/2013  . Hyperlipidemia 02/26/2013  . Cystocele 12/11/2011  . NONSPECIFIC ABN FINDING RAD & OTH EXAM GI TRACT 12/20/2009    Past Surgical History:  Procedure Laterality Date  . ANKLE FUSION  2009   Right  .  APPENDECTOMY  1967   with removal of ovarian cyst  . BUNIONECTOMY     bilateral  . CATARACT EXTRACTION, BILATERAL    . CYSTOCELE REPAIR  12/11/2011   Procedure: ANTERIOR REPAIR (CYSTOCELE);  Surgeon: Jonnie Kind, MD;  Location: [facility];  Service: Gynecology;  Laterality: N/A;  . EXAMINATION UNDER ANESTHESIA  12/11/2011   Procedure: EXAM UNDER ANESTHESIA;  Surgeon: Jonnie Kind, MD;  Location: [facility];  Service: Gynecology;  Laterality: N/A;  . EXCISION MORTON'S NEUROMA     left?  . FOREIGN BODY REMOVAL VAGINAL  12/11/2011   Procedure: REMOVAL FOREIGN BODY VAGINAL;  Surgeon: Jonnie Kind, MD;  Location: [facility];  Service: Gynecology;  Laterality: N/A;  impacted pessary  . ORIF ELBOW FRACTURE  age 23   after fx at age 62, Left  . PARATHYROID EXPLORATION  2004   removal of 2 adenoma tumors   . THYROID SURGERY  1950   removal of tumor, adenoma  . ULNAR NERVE TRANSPOSITION  age 89    OB History    No data available       Home Medications    Prior to Admission medications   Medication Sig Start Date End Date Taking? Authorizing Provider  acetaminophen (TYLENOL) 325 MG tablet Take 1,300 mg by mouth every morning. And  as needed    Historical Provider, MD  Calcium Acetate 667 MG TABS Take 667 mg by mouth 3 (three) times daily.    Historical Provider, MD  Cholecalciferol (VITAMIN D-3) 1000 UNITS CAPS Take 1,000 Units by mouth daily.    Historical Provider, MD  HYDROcodone-acetaminophen (NORCO/VICODIN) 5-325 MG tablet Take 1 tablet by mouth every 4 (four) hours as needed for moderate pain. 10/30/16   Nita Sells, MD  levothyroxine (SYNTHROID, LEVOTHROID) 150 MCG tablet Take 1 tablet (150 mcg total) by mouth daily before breakfast. 10/30/16   Nita Sells, MD  Magnesium 250 MG TABS Take 250 mg by mouth at bedtime.    Historical Provider, MD  metoprolol succinate (TOPROL-XL) 50 MG 24 hr tablet Take 1 tablet (50 mg total) by mouth daily. Take with or immediately following a  meal. 10/30/16   Nita Sells, MD  MYRBETRIQ 25 MG TB24 tablet Take 25 mg by mouth at bedtime. 09/28/14   Historical Provider, MD  verapamil (CALAN-SR) 120 MG CR tablet Take 1 tablet (120 mg total) by mouth daily. 10/30/16   Nita Sells, MD  vitamin C (ASCORBIC ACID) 500 MG tablet Take 500 mg by mouth daily.    Historical Provider, MD    Family History Family History  Problem Relation Age of Onset  . Hypertension Father   . Heart attack Father   . Stroke Father   . Hypertension Brother   . Kidney disease Brother   . Heart attack Daughter   . Anesthesia problems Neg Hx   . Hypotension Neg Hx   . Pseudochol deficiency Neg Hx   . Malignant hyperthermia Neg Hx     Social History Social History  Substance Use Topics  . Smoking status: Former Smoker    Packs/day: 0.20    Years: 60.00    Quit date: 07/30/2014  . Smokeless tobacco: Never Used  . Alcohol use No     Allergies   Ciprofloxacin; Gabapentin; Nsaids; Nitrofurantoin; and Terbinafine hcl   Review of Systems Review of Systems  Respiratory: Negative for shortness of breath.   Cardiovascular: Negative for chest pain.  Gastrointestinal: Negative for blood in stool, nausea and vomiting.  Neurological: Positive for dizziness. Negative for weakness and headaches.  Ten systems are reviewed and are negative for acute change except as noted in the HPI    Physical Exam Updated Vital Signs BP 159/71 (BP Location: Left Arm)   Pulse 70   Temp 97.6 F (36.4 C) (Oral)   Resp 17   Ht 5\' 7"  (1.702 m)   Wt 154 lb (69.9 kg)   SpO2 96%   BMI 24.12 kg/m   Physical Exam  Constitutional: She is oriented to person, place, and time. She appears well-developed and well-nourished. No distress.  HENT:  Head: Normocephalic and atraumatic.  Nose: Nose normal.  Eyes: Conjunctivae and EOM are normal. Pupils are equal, round, and reactive to light. Right eye exhibits no discharge. Left eye exhibits no discharge. No scleral  icterus.  Neck: Normal range of motion. Neck supple.  Cardiovascular: Normal rate and regular rhythm.  Exam reveals no gallop and no friction rub.   No murmur heard. Pulmonary/Chest: Effort normal and breath sounds normal. No stridor. No respiratory distress. She has no rales.  Abdominal: Soft. She exhibits no distension. There is no tenderness.  Musculoskeletal: She exhibits no edema or tenderness.  Neurological: She is alert and oriented to person, place, and time.  Mental Status: Alert and oriented to person, place, and time.  Attention and concentration normal. Speech clear. Recent memory is intac  Cranial Nerves  II Visual Fields: Intact to confrontation. Visual fields intact. III, IV, VI: Pupils equal and reactive to light and near. Full eye movement without nystagmus  V Facial Sensation: Normal. No weakness of masticatory muscles  VII: No facial weakness or asymmetry  VIII Auditory Acuity: Grossly normal  IX/X: The uvula is midline; the palate elevates symmetrically  XI: Normal sternocleidomastoid and trapezius strength  XII: The tongue is midline. No atrophy or fasciculations.   Motor System: Muscle Strength: 5/5 and symmetric in the upper and lower extremities. No pronation or drift.  Muscle Tone: Tone and muscle bulk are normal in the upper and lower extremities.   Reflexes: DTRs: 1+ and symmetrical in all four extremities. Plantar responses are flexor bilaterally.  Coordination: No tremor.  Sensation: Intact to light touch, and pinprick.  Gait: deferred due to symptoms  HINTS: Nystagmus: no Head impulse. +to the left Skew: negative   Skin: Skin is warm and dry. No rash noted. She is not diaphoretic. No erythema.  Psychiatric: She has a normal mood and affect.  Vitals reviewed.    ED Treatments / Results  Labs (all labs ordered are listed, but only abnormal results are displayed) Labs Reviewed - No data to display  EKG  EKG Interpretation  Date/Time:  Tuesday  November 06 2016 17:46:19 EST Ventricular Rate:  65 PR Interval:    QRS Duration: 82 QT Interval:  422 QTC Calculation: 439 R Axis:   -23 Text Interpretation:  Sinus rhythm Abnormal R-wave progression, early transition Inferior infarct, old No significant change since last tracing Confirmed by [facility] MD, Jaci Desanto [id number]) on 11/06/2016 5:55:25 PM       Radiology No results found.  Procedures Procedures (including critical care time)  Medications Ordered in ED Medications - No data to display   Initial Impression / Assessment and Plan / ED Course  I have reviewed the triage vital signs and the nursing notes.  Pertinent labs & imaging results that were available during my care of the patient were reviewed by me and considered in my medical decision making (see chart for details).     Exam reassuring for peripheral etiology. Given lack of PO fluids will hydrate. Also given low dose valium given age. Some improvement but still symptomatic. Given her home situation feels pt will need to be admitted for symptomatic treatment and facilitation of her placement.  Discussed with Hospitalist who will admit.  Final Clinical Impressions(s) / ED Diagnoses   Final diagnoses:  Vertigo      Fatima Blank, MD 11/06/16 2209

## 2016-11-06 NOTE — H&P (Signed)
[facility] H&P    Patient Demographics:    Taylor Giles, is a 83 y.o. female  MRN: [id number]  DOB - 01-May-1928  Admit Date - 11/06/2016  Referring MD/NP/PA: Dr. Leonette Monarch  Outpatient Primary MD for the patient is Purvis Kilts, MD  Patient coming from: Home  Chief Complaint  Patient presents with  . Dizziness      HPI:    Taylor Giles  is a 83 y.o. female,  with history of hypertension, hypothyroidism, hypertension, chronic kidney disease stage III who came to the hospital with chief complaint of dizziness. Patient was recently discharged from the hospital with generalized weakness at that time her antihypertensive medications were changed. Patient was taking medications as prescribed and today noticed that she had an episode of dizziness in the bathroom. She denies passing out. She called friends who brought her to the ED. Denies falling onto the floor or hitting her head. She denies nausea vomiting or diarrhea. No chest or shortness of breath. No blurred vision. No slurred speech. No chest pain shortness of breath. No dysuria urgency frequency of urination.  In the ED lab work revealed creatinine 1.65, her last creatinine on 10/29/2016 was 1.36    Review of systems:      A full 10 point Review of Systems was done, except as stated above, all other Review of Systems were negative.   With Past History of the following :    Past Medical History:  Diagnosis Date  . Anemia   . Arthritis   . Chronic kidney disease    due to use of votaren, sees Dr. Lowanda Foster  . Gall stones   . Hyperlipidemia   . Hypertension   . Hypothyroidism   . Melanoma ([place])    left elbow  s/p excision 1982  . Mitral valve prolapse   . Osteopenia   . PONV (postoperative nausea and vomiting)   . Syncope       Past Surgical History:  Procedure Laterality Date  . ANKLE FUSION  2009   Right  . APPENDECTOMY  1967   with  removal of ovarian cyst  . BUNIONECTOMY     bilateral  . CATARACT EXTRACTION, BILATERAL    . CYSTOCELE REPAIR  12/11/2011   Procedure: ANTERIOR REPAIR (CYSTOCELE);  Surgeon: Jonnie Kind, MD;  Location: [facility];  Service: Gynecology;  Laterality: N/A;  . EXAMINATION UNDER ANESTHESIA  12/11/2011   Procedure: EXAM UNDER ANESTHESIA;  Surgeon: Jonnie Kind, MD;  Location: [facility];  Service: Gynecology;  Laterality: N/A;  . EXCISION MORTON'S NEUROMA     left?  . FOREIGN BODY REMOVAL VAGINAL  12/11/2011   Procedure: REMOVAL FOREIGN BODY VAGINAL;  Surgeon: Jonnie Kind, MD;  Location: [facility];  Service: Gynecology;  Laterality: N/A;  impacted pessary  . ORIF ELBOW FRACTURE  age 62   after fx at age 83, Left  . PARATHYROID EXPLORATION  2004   removal of 2 adenoma tumors   . THYROID SURGERY  1950   removal of tumor, adenoma  .  ULNAR NERVE TRANSPOSITION  age 75      Social History:      Social History  Substance Use Topics  . Smoking status: Former Smoker    Packs/day: 0.20    Years: 60.00    Quit date: 07/30/2014  . Smokeless tobacco: Never Used  . Alcohol use No       Family History :     Family History  Problem Relation Age of Onset  . Hypertension Father   . Heart attack Father   . Stroke Father   . Hypertension Brother   . Kidney disease Brother   . Heart attack Daughter   . Anesthesia problems Neg Hx   . Hypotension Neg Hx   . Pseudochol deficiency Neg Hx   . Malignant hyperthermia Neg Hx       Home Medications:   Prior to Admission medications   Medication Sig Start Date End Date Taking? Authorizing Provider  acetaminophen (TYLENOL) 325 MG tablet Take 1,300 mg by mouth every morning. And as needed   Yes Historical Provider, MD  Calcium Acetate 667 MG TABS Take 667 mg by mouth 3 (three) times daily.   Yes Historical Provider, MD  Cholecalciferol (VITAMIN D-3) 1000 UNITS CAPS Take 2,000 Units by mouth daily.    Yes Historical Provider, MD    HYDROcodone-acetaminophen (NORCO/VICODIN) 5-325 MG tablet Take 1 tablet by mouth every 4 (four) hours as needed for moderate pain. 10/30/16  Yes Nita Sells, MD  levothyroxine (SYNTHROID, LEVOTHROID) 150 MCG tablet Take 1 tablet (150 mcg total) by mouth daily before breakfast. 10/30/16  Yes Nita Sells, MD  Magnesium 250 MG TABS Take 250 mg by mouth at bedtime.   Yes Historical Provider, MD  meclizine (ANTIVERT) 12.5 MG tablet Take 12.5 mg by mouth 3 (three) times daily as needed for dizziness.   Yes Historical Provider, MD  metoprolol succinate (TOPROL-XL) 50 MG 24 hr tablet Take 1 tablet (50 mg total) by mouth daily. Take with or immediately following a meal. Patient taking differently: Take 100 mg by mouth daily. Take with or immediately following a meal. 10/30/16  Yes Nita Sells, MD  MYRBETRIQ 25 MG TB24 tablet Take 25 mg by mouth at bedtime. 09/28/14  Yes Historical Provider, MD  verapamil (CALAN-SR) 120 MG CR tablet Take 1 tablet (120 mg total) by mouth daily. 10/30/16  Yes Nita Sells, MD  vitamin C (ASCORBIC ACID) 500 MG tablet Take 500 mg by mouth daily.   Yes Historical Provider, MD     Allergies:     Allergies  Allergen Reactions  . Gabapentin   . Nsaids     Nephrologist ordered not to take d/t chronic kidney disease  . Ciprofloxacin Itching and Rash    REACTION: itchy rash  . Nitrofurantoin Rash    macrobid  . Terbinafine Hcl Rash     Physical Exam:   Vitals  Blood pressure 163/79, pulse 64, temperature 97.6 F (36.4 C), temperature source Oral, resp. rate 15, height 5\' 7"  (1.702 m), weight 69.9 kg (154 lb), SpO2 97 %.  1.  General: Appears in no acute distress  2. Psychiatric:  Intact judgement and  insight, awake alert, oriented x 3.  3. Neurologic: No focal neurological deficits, all cranial nerves intact.Strength 5/5 all 4 extremities, sensation intact all 4 extremities, plantars down going.  4. Eyes :  anicteric sclerae, moist  conjunctivae with no lid lag. PERRLA.  5. ENMT:  Oropharynx clear with moist mucous membranes and good dentition  6. Neck:  supple, no cervical lymphadenopathy appriciated, No thyromegaly  7. Respiratory : Normal respiratory effort, good air movement bilaterally,clear to  auscultation bilaterally  8. Cardiovascular : RRR, no gallops, rubs or murmurs, no leg edema  9. Gastrointestinal:  Positive bowel sounds, abdomen soft, non-tender to palpation,no hepatosplenomegaly, no rigidity or guarding       10. Skin:  No cyanosis, normal texture and turgor, no rash, lesions or ulcers  11.Musculoskeletal:  Good muscle tone,  joints appear normal , no effusions,  normal range of motion    Data Review:    CBC  Recent Labs Lab 11/06/16 1834  WBC 9.4  HGB 11.7*  HCT 34.7*  PLT 247  MCV 101.8*  MCH 34.3*  MCHC 33.7  RDW 14.0  LYMPHSABS 1.5  MONOABS 0.9  EOSABS 0.1  BASOSABS 0.0   ------------------------------------------------------------------------------------------------------------------  Chemistries   Recent Labs Lab 11/06/16 1834  NA 139  K 4.0  CL 106  CO2 24  GLUCOSE 102*  BUN 23*  CREATININE 1.65*  CALCIUM 9.1   ------------------------------------------------------------------------------------------------------------------  ------------------------------------------------------------------------------------------------------------------  --------------------------------------------------------------------------------------------------------------- Urine analysis:    Component Value Date/Time   COLORURINE STRAW (A) 11/06/2016 1819   APPEARANCEUR CLEAR 11/06/2016 1819   LABSPEC 1.008 11/06/2016 1819   PHURINE 6.0 11/06/2016 1819   GLUCOSEU NEGATIVE 11/06/2016 1819   HGBUR NEGATIVE 11/06/2016 1819   BILIRUBINUR NEGATIVE 11/06/2016 1819   KETONESUR NEGATIVE 11/06/2016 1819   PROTEINUR 30 (A) 11/06/2016 1819   UROBILINOGEN 1.0 12/11/2011 1121    NITRITE NEGATIVE 11/06/2016 1819   LEUKOCYTESUR SMALL (A) 11/06/2016 1819      Imaging Results:    No results found.  My personal review of EKG: Rhythm NSR   Assessment & Plan:    Active Problems:   HTN (hypertension)   Hyperlipidemia   Hypothyroidism   Dizziness   1. Dizziness- place under observation, will obtain orthostatic vital signs every shift, vestibular PT in a.m. Will also obtain MRI brain. CT head obtained in previous admission was negative for any acute abnormality. Continue meclizine when necessary for dizziness 2. Acute kidney injury on CKD stage III- creatinine 1.65, he was creatinine was 1.37. Start gentle IV hydration with normal saline at 75 ml per hour. Follow BMP in a.m. 3. Hypothyroidism-continue Synthroid. 4. Hypertension- patient is on verapamil and metoprolol. Will continue metoprolol 50 mg along with verapamil. 5. Bradycardia-patient has history of bradycardia, will monitor on telemetry. This itself can also cause symptoms of dizziness. If patient's heart rate consistently stays below 60 consider changing verapamil to some other antihypertensive medication. 6. History of depression-continue Myrbetriq.   DVT Prophylaxis-   Lovenox   AM Labs Ordered, also please review Full Orders  Family Communication: Admission, patients condition and plan of care including tests being ordered have been discussed with the patient  who indicate understanding and agree with the plan and Code Status.  Code Status:DO NOT RESUSCITATE  Admission status: Observation    Time spent in minutes : 60 minutes   Westyn Driggers S M.D on 11/06/2016 at 10:41 PM  Between 7am to 7pm - Pager - [phone]. After 7pm go to [email or web address] - password [facility]  [facility] - Office  [phone]

## 2016-11-06 NOTE — ED Triage Notes (Addendum)
Patient reports of chronic vertigo "for years". States she takes Meclizine for it but it isnt working. Just completed antibiotics for UTI. Patient states she does not know what time her dizziness began this evening but took her Meclizine after the dizziness began. Dizziness is worse when she changes positions. Seen here in ED a few days ago per patient. Alert and oriented x4. No deficits noted. Denies visual changes.

## 2016-11-07 ENCOUNTER — Observation Stay (HOSPITAL_COMMUNITY): Payer: Medicare HMO

## 2016-11-07 DIAGNOSIS — J449 Chronic obstructive pulmonary disease, unspecified: Secondary | ICD-10-CM | POA: Diagnosis not present

## 2016-11-07 DIAGNOSIS — E860 Dehydration: Secondary | ICD-10-CM | POA: Diagnosis not present

## 2016-11-07 DIAGNOSIS — M6281 Muscle weakness (generalized): Secondary | ICD-10-CM | POA: Diagnosis not present

## 2016-11-07 DIAGNOSIS — R42 Dizziness and giddiness: Secondary | ICD-10-CM | POA: Diagnosis not present

## 2016-11-07 DIAGNOSIS — H8110 Benign paroxysmal vertigo, unspecified ear: Secondary | ICD-10-CM

## 2016-11-07 LAB — COMPREHENSIVE METABOLIC PANEL
ALBUMIN: 3.3 g/dL — AB (ref 3.5–5.0)
ALT: 17 U/L (ref 14–54)
ANION GAP: 9 (ref 5–15)
AST: 21 U/L (ref 15–41)
Alkaline Phosphatase: 42 U/L (ref 38–126)
BUN: 20 mg/dL (ref 6–20)
CHLORIDE: 107 mmol/L (ref 101–111)
CO2: 24 mmol/L (ref 22–32)
Calcium: 8.6 mg/dL — ABNORMAL LOW (ref 8.9–10.3)
Creatinine, Ser: 1.19 mg/dL — ABNORMAL HIGH (ref 0.44–1.00)
GFR calc Af Amer: 47 mL/min — ABNORMAL LOW (ref >60)
GFR calc non Af Amer: 40 mL/min — ABNORMAL LOW (ref >60)
GLUCOSE: 83 mg/dL (ref 65–99)
POTASSIUM: 3.9 mmol/L (ref 3.5–5.1)
SODIUM: 140 mmol/L (ref 135–145)
Total Bilirubin: 0.5 mg/dL (ref 0.3–1.2)
Total Protein: 6.1 g/dL — ABNORMAL LOW (ref 6.5–8.1)

## 2016-11-07 LAB — CBC
HCT: 35.3 % — ABNORMAL LOW (ref 36.0–46.0)
Hemoglobin: 11.7 g/dL — ABNORMAL LOW (ref 12.0–15.0)
MCH: 33.7 pg (ref 26.0–34.0)
MCHC: 33.1 g/dL (ref 30.0–36.0)
MCV: 101.7 fL — ABNORMAL HIGH (ref 78.0–100.0)
Platelets: 245 10*3/uL (ref 150–400)
RBC: 3.47 MIL/uL — ABNORMAL LOW (ref 3.87–5.11)
RDW: 14.2 % (ref 11.5–15.5)
WBC: 8.1 10*3/uL (ref 4.0–10.5)

## 2016-11-07 MED ORDER — ENOXAPARIN SODIUM 30 MG/0.3ML ~~LOC~~ SOLN
30.0000 mg | SUBCUTANEOUS | Status: DC
  Administered 2016-11-07: 30 mg via SUBCUTANEOUS
  Filled 2016-11-07: qty 0.3

## 2016-11-07 MED ORDER — METOPROLOL SUCCINATE ER 50 MG PO TB24
50.0000 mg | ORAL_TABLET | Freq: Every day | ORAL | Status: DC
  Administered 2016-11-07: 50 mg via ORAL
  Filled 2016-11-07: qty 1

## 2016-11-07 MED ORDER — MAGNESIUM OXIDE 400 (241.3 MG) MG PO TABS
400.0000 mg | ORAL_TABLET | Freq: Every day | ORAL | Status: DC
  Administered 2016-11-07: 400 mg via ORAL
  Filled 2016-11-07: qty 1

## 2016-11-07 MED ORDER — ONDANSETRON HCL 4 MG/2ML IJ SOLN: 4.0000 mg | Freq: Four times a day (QID) | INTRAMUSCULAR | Status: DC | PRN

## 2016-11-07 MED ORDER — ACETAMINOPHEN 650 MG RE SUPP
650.0000 mg | Freq: Four times a day (QID) | RECTAL | Status: DC | PRN
Start: 2016-11-07 — End: 2016-11-07

## 2016-11-07 MED ORDER — VERAPAMIL HCL ER 120 MG PO TBCR
120.0000 mg | EXTENDED_RELEASE_TABLET | Freq: Every day | ORAL | Status: DC
  Administered 2016-11-07: 120 mg via ORAL
  Filled 2016-11-07 (×3): qty 1

## 2016-11-07 MED ORDER — ENOXAPARIN SODIUM 40 MG/0.4ML ~~LOC~~ SOLN: 40.0000 mg | SUBCUTANEOUS | Status: DC

## 2016-11-07 MED ORDER — SODIUM CHLORIDE 0.9 % IV SOLN
INTRAVENOUS | Status: DC
  Administered 2016-11-07 (×2): via INTRAVENOUS

## 2016-11-07 MED ORDER — ACETAMINOPHEN 325 MG PO TABS: 650.0000 mg | ORAL_TABLET | Freq: Four times a day (QID) | ORAL | Status: DC | PRN

## 2016-11-07 MED ORDER — MIRABEGRON ER 25 MG PO TB24
25.0000 mg | ORAL_TABLET | Freq: Every day | ORAL | Status: DC
  Administered 2016-11-07: 25 mg via ORAL
  Filled 2016-11-07: qty 1

## 2016-11-07 MED ORDER — MECLIZINE HCL 12.5 MG PO TABS
12.5000 mg | ORAL_TABLET | Freq: Three times a day (TID) | ORAL | Status: DC | PRN
  Administered 2016-11-07: 12.5 mg via ORAL
  Filled 2016-11-07: qty 1

## 2016-11-07 MED ORDER — VITAMIN C 500 MG PO TABS
500.0000 mg | ORAL_TABLET | Freq: Every day | ORAL | Status: DC
  Administered 2016-11-07: 500 mg via ORAL
  Filled 2016-11-07: qty 1

## 2016-11-07 MED ORDER — ONDANSETRON HCL 4 MG PO TABS: 4.0000 mg | ORAL_TABLET | Freq: Four times a day (QID) | ORAL | Status: DC | PRN

## 2016-11-07 MED ORDER — HYDROCODONE-ACETAMINOPHEN 5-325 MG PO TABS: 1.0000 | ORAL_TABLET | ORAL | Status: DC | PRN

## 2016-11-07 MED ORDER — LEVOTHYROXINE SODIUM 50 MCG PO TABS
150.0000 ug | ORAL_TABLET | Freq: Every day | ORAL | Status: DC
  Administered 2016-11-07: 150 ug via ORAL
  Filled 2016-11-07: qty 3

## 2016-11-07 NOTE — Progress Notes (Signed)
PROGRESS NOTE  Taylor Giles [id number] DOB: [birth year] DOA: 11/06/2016 PCP: Purvis Kilts, MD  Brief Narrative: 83 year old woman presented with dizziness, reported history of chronic vertigo. No syncope. Admitted for further evaluation of dizziness, orthostatic checks, PT evaluation, acute kidney injury superimposed on chronic kidney disease stage III.  Assessment/Plan 1. Vertigo, dizziness. Patient unable to tolerate closed MRI. Can pursue open MRI as an outpatient if clinically indicated. CT head was negative 12 days ago. Continue meclizine. Evaluated by physical therapy, findings inconsistent with BPPV, Mnire's disease. Suggestive of orthostatic hypotension although this has not been demonstrated this admission. CT head was negative 10/26/2016. 2. Symptomatic bradycardia? This has been considered but not proven. She is on both verapamil and Toprol. 3. Dehydration versus AKI superimposed on chronic kidney disease stage III. Resolved. Creatinine at baseline. 4. Generalized weakness. Did poorly with PT, skilled nursing facility recommended, however per case management patient is not a candidate according to insurance. 5. Elevated TSH of 40 on 11/13/2016.  6. Chronic, long-standing vertigo, treated with meclizine as an outpatient. 7. Just completed treatment for UTI   Patient appears stable. She is evaluated by physical therapy and noted to be deconditioned, skilled nursing facility recommended however the patient's insurance refused to approve this on last admission. Per case management she does not meet observation criteria and ABM was given. Patient subsequently decided to then go home and pursue any further testing as an outpatient. Given her symptoms and her reasonable blood pressure, I think it's reasonable to discontinue verapamil. She has no history of arrhythmia. She may be sensitive to lower blood pressure or relative bradycardia.  She will go home and her neighbor will stay  with her and watch over her. Home health planned.  Recommend close outpatient follow-up, consider outpatient MRI brain of symptoms fail to improve, consider echocardiogram as an outpatient.  Murray Hodgkins, MD  [facility] Direct contact: [phone] --Via amion app OR  --[email or web address]; password [email or web address]  7PM-7AM contact night coverage as above 11/07/2016, 3:17 PM  LOS: 0 days   Consultants:    Procedures:    Antimicrobials:    Interval history/Subjective: Feels better. Minimal dizziness. Describes some vertigo but not necessarily triggered by movement. Neighbor at bedside reports she's had every other day episodes of vertigo for at least 2 weeks. Patient reports chronic intermittent vertigo. No chest pain or shortness of breath.  Objective: Vitals:   11/06/16 2200 11/06/16 2230 11/06/16 2330 11/07/16 0020  BP: 163/79 173/77 134/98   Pulse: 64 (!) 57    Resp: 15 12 21    Temp:    97.5 F (36.4 C)  TempSrc:    Oral  SpO2: 97% 97%  99%  Weight:    70.8 kg (156 lb 1.4 oz)  Height:    5\' 7"  (1.702 m)    Intake/Output Summary (Last 24 hours) at 11/07/16 1517 Last data filed at 11/07/16 1300  Gross per 24 hour  Intake              925 ml  Output              250 ml  Net              675 ml     Filed Weights   11/06/16 1737 11/07/16 0020  Weight: 69.9 kg (154 lb) 70.8 kg (156 lb 1.4 oz)    Exam:    Constitutional:   Appears calm, comfortable Respiratory:   Clear to auscultation bilaterally, no wheezes, rales  or rhonchi. Normal respiratory effort. Cardiovascular:   Regular rate and rhythm. No murmur, rub or gallop.  No lower extremity edema.  Telemetry sinus rhythm Musculoskeletal:   Moves all extremities well Psychiatric:   Alert, speech fluent and clear.   I have personally reviewed following labs and imaging studies:  Basic metabolic panel shows normalization of BUN, creatinine back to baseline or better.  CBC unremarkable  Urinalysis  equivocal, however patient asymptomatic and therefore would not treat.  Scheduled Meds: . [START ON 11/08/2016] enoxaparin (LOVENOX) injection  40 mg Subcutaneous Q24H  . levothyroxine  150 mcg Oral QAC breakfast  . magnesium oxide  400 mg Oral QHS  . metoprolol succinate  50 mg Oral Daily  . mirabegron ER  25 mg Oral QHS  . verapamil  120 mg Oral Daily  . vitamin C  500 mg Oral Daily   Continuous Infusions: . sodium chloride 75 mL/hr at 11/07/16 1351    Active Problems:   HTN (hypertension)   Hyperlipidemia   Hypothyroidism   Dizziness   LOS: 0 days

## 2016-11-07 NOTE — Discharge Summary (Signed)
Physician Discharge Summary  Taylor Giles [id number] DOB: January 08, 1928 DOA: 11/06/2016  PCP: Purvis Kilts, MD  Admit date: 11/06/2016 Discharge date: 11/07/2016  Recommendations for Outpatient Follow-up:  1. Follow-up chronic vertigo. Consider outpatient MRI (open) if symptoms persist. Physical therapy planned. 2. Verapamil discontinued, see rationale below 3. TSH 40 on last admission. Consider retesting as an outpatient. Consider starting levothyroxine.  Follow-up Information    Purvis Kilts, MD. Schedule an appointment as soon as possible for a visit in 1 week(s).   Specialty:  Family Medicine Contact information: [address] [place] [id number] [phone]          Discharge Diagnoses:  1. Vertigo, dizziness 2. Dehydration 3. Chronic kidney disease stage III and some generalized weakness 4. Elevated TSH  Discharge Condition: improved Disposition: home [facility]  Diet recommendation: regular  Filed Weights   11/06/16 1737 11/07/16 0020  Weight: 69.9 kg (154 lb) 70.8 kg (156 lb 1.4 oz)    History of present illness:  83 year old woman presented with dizziness, reported history of chronic vertigo. No syncope. Admitted for further evaluation of dizziness, orthostatic checks, PT evaluation, acute kidney injury superimposed on chronic kidney disease stage III.  Hospital Course:  Observed overnight. Dizziness improved. Telemetry was unrevealing. Blood pressure stable. Renal function returned to normal. She was seen by physical therapy with recommendation for skilled nursing facility placement, however this was denied by her insurance company previously. Therefore the patient elected to go home with outpatient follow-up. Given the patient's symptoms, suspect this is related to antihypertensives and rate control agents. On previous hospitalization to blood pressure medications were discontinued. This hospitalization verapamil discontinued. Individual issues  as below.  1. Vertigo, dizziness. Patient unable to tolerate closed MRI. Can pursue open MRI as an outpatient if clinically indicated. CT head was negative 12 days ago. Continue meclizine. Evaluated by physical therapy, findings inconsistent with BPPV, Mnire's disease. Suggestive of orthostatic hypotension although this has not been demonstrated this admission. CT head was negative 10/26/2016. 2. Symptomatic bradycardia? This has been considered but not proven. She is on both verapamil and Toprol. 3. Dehydration versus AKI superimposed on chronic kidney disease stage III. Resolved. Creatinine at baseline. 4. Generalized weakness. Did poorly with PT, skilled nursing facility recommended, however per case management patient is not a candidate according to insurance. 5. Elevated TSH of 40 on 11/13/2016.  6. Chronic, long-standing vertigo, treated with meclizine as an outpatient. 7. Just completed treatment for UTI   Patient appears stable. She is evaluated by physical therapy and noted to be deconditioned, skilled nursing facility recommended however the patient's insurance refused to approve this on last admission. Per case management she does not meet observation criteria and ABM was given. Patient subsequently decided to then go home and pursue any further testing as an outpatient. Given her symptoms and her reasonable blood pressure, I think it's reasonable to discontinue verapamil. She has no history of arrhythmia. She may be sensitive to lower blood pressure or relative bradycardia.  She will go home and her neighbor will stay with her and watch over her. Home health planned.  Recommend close outpatient follow-up, consider outpatient MRI brain of symptoms fail to improve, consider echocardiogram as an outpatient.   Discharge Instructions  Discharge Instructions    Diet general    Complete by:  As directed    Discharge instructions    Complete by:  As directed    Call your physician or  seek immediate medical attention for fall, weakness,  dizziness, numbness or worsening of condition.   Increase activity slowly    Complete by:  As directed      Allergies as of 11/07/2016      Reactions   Gabapentin    Nsaids    Nephrologist ordered not to take d/t chronic kidney disease   Ciprofloxacin Itching, Rash   REACTION: itchy rash   Nitrofurantoin Rash   macrobid   Terbinafine Hcl Rash      Medication List    STOP taking these medications   HYDROcodone-acetaminophen 5-325 MG tablet Commonly known as:  NORCO/VICODIN   verapamil 120 MG CR tablet Commonly known as:  CALAN-SR     TAKE these medications   acetaminophen 325 MG tablet Commonly known as:  TYLENOL Take 1,300 mg by mouth every morning. And as needed   Calcium Acetate 667 MG Tabs Take 667 mg by mouth 3 (three) times daily.   levothyroxine 150 MCG tablet Commonly known as:  SYNTHROID, LEVOTHROID Take 1 tablet (150 mcg total) by mouth daily before breakfast.   Magnesium 250 MG Tabs Take 250 mg by mouth at bedtime.   meclizine 12.5 MG tablet Commonly known as:  ANTIVERT Take 12.5 mg by mouth 3 (three) times daily as needed for dizziness.   metoprolol succinate 50 MG 24 hr tablet Commonly known as:  TOPROL-XL Take 1 tablet (50 mg total) by mouth daily. Take with or immediately following a meal. What changed:  how much to take  additional instructions   MYRBETRIQ 25 MG Tb24 tablet Generic drug:  mirabegron ER Take 25 mg by mouth at bedtime.   vitamin C 500 MG tablet Commonly known as:  ASCORBIC ACID Take 500 mg by mouth daily.   Vitamin D-3 1000 units Caps Take 2,000 Units by mouth daily.            Durable Medical Equipment        Start     Ordered   11/07/16 1130  For home use only DME Bedside commode  Once    Question:  Patient needs a bedside commode to treat with the following condition  Answer:  Dizziness   11/07/16 1130     Allergies  Allergen Reactions  . Gabapentin     . Nsaids     Nephrologist ordered not to take d/t chronic kidney disease  . Ciprofloxacin Itching and Rash    REACTION: itchy rash  . Nitrofurantoin Rash    macrobid  . Terbinafine Hcl Rash    The results of significant diagnostics from this hospitalization (including imaging, microbiology, ancillary and laboratory) are listed below for reference.    Labs: Basic Metabolic Panel:  Recent Labs Lab 11/06/16 1834 11/07/16 0526  NA 139 140  K 4.0 3.9  CL 106 107  CO2 24 24  GLUCOSE 102* 83  BUN 23* 20  CREATININE 1.65* 1.19*  CALCIUM 9.1 8.6*   Liver Function Tests:  Recent Labs Lab 11/07/16 0526  AST 21  ALT 17  ALKPHOS 42  BILITOT 0.5  PROT 6.1*  ALBUMIN 3.3*   CBC:  Recent Labs Lab 11/06/16 1834 11/07/16 0526  WBC 9.4 8.1  NEUTROABS 6.9  --   HGB 11.7* 11.7*  HCT 34.7* 35.3*  MCV 101.8* 101.7*  PLT 247 245     Active Problems:   HTN (hypertension)   Hyperlipidemia   Hypothyroidism   Dizziness   Time coordinating discharge: 45 minutes  Signed:  Murray Hodgkins, MD [facility] 11/07/2016, 5:55 PM

## 2016-11-07 NOTE — Care Management Note (Signed)
Case Management Note  Patient Details  Name: BERDENE LUSKIN MRN: [id number] Date of Birth: [birth year]  Subjective/Objective:                  Pt is from home, lives alone and is active with [facility]. Pt plans to return home with resumption of [facility] services. She feels she would benefit from [facility]. Pt has chosen [facility] from list of DME providers. Romualdo Bolk, of [facility], aware of [place] and DME needs. She will obtain pt info from chart and deliver [facility] to pt room prior to DC. Pt aware HH has 48 hrs to resume services. Pt under observation and will not require order to resume HH.   Action/Plan: Will DC home with resumption of [place] services and DME.   Expected Discharge Date:     11/07/2016             Expected Discharge Plan:  [place]  In-House Referral:  NA  Discharge planning Services  CM Consult  Post Acute Care Choice:  Home Health, Resumption of Svcs/PTA Provider Choice offered to:  Patient  Status of Service:  Completed, signed off  Sherald Barge, RN 11/07/2016, 9:24 AM

## 2016-11-07 NOTE — Care Management Obs Status (Signed)
[place] NOTIFICATION   Patient Details  Name: Taylor Giles MRN: [id number] Date of Birth: July 13, 1928   Medicare Observation Status Notification Given:  Yes    Sherald Barge, RN 11/07/2016, 9:00 AM

## 2016-11-07 NOTE — Evaluation (Signed)
Physical Therapy Evaluation Patient Details Name: Taylor Giles MRN: [id number] DOB: 11-22-1927 Today's Date: 11/07/2016   History of Present Illness  Taylor Giles is an 83yo white person who identifies as female [facility] after concerning episode of dizziness and general unease. Pt was admittied recently for similar episode and DC to home with homehealth services, although recommended STR, did not meet patient status. PMH: CKD-III, HTN, HLD, hypothyroidism, Rt TKA (~2YA).   Clinical Impression  Pt admitted with above diagnosis. Pt currently with functional limitations due to the deficits listed below (see "PT Problem List"). Upon entry, the patient is received semirecumbent in bed, no family/caregiver present.   The pt is awake and agreeable to participate. No acute distress noted at this time, pt reporting that her dizziness is resolve while she has been semirecumbent. The pt is alert and oriented x3, pleasant, conversational, and following simple and multi-step commands consistently.   Vestibular Assessment:  Eye tracking: WNL, no nystagmus noted, smooth pursuits are WNL. Pt has difficulty providing a highly detailed history, however patient denies room spinning sensations, denies recent head cold, sinus infection, ear ache. Denies any sudden onset dizziness with changes in bed position, or moving from supine to sitting at EOB. Pt denies dizziness episodes lasting shorter than 1 minute; and reports they typically last between 1-6 hours. During examination, dizziness is worsening when moving seated to standing 2x, and resolves with return to sitting (orthostatic vitals are WNL). Dizziness is worsened with end-range sustained Right cervical rotation, but not aggravated by end range Left cervical rotation. Pt denies change in hearing or tinnitus. Pt denies any changes to vision or loss of vision. Recent labs revealing most recent Hb: 11.7, HCT: 35.3, both stable since admission. Findings are  inconsistent with BPPV and inconsistent with typical presentation of Meniere's disease. Symptoms are most consistent with orthostatic hypotension, however the patient is not orthostatic this assessment, nor previous assessments this admission. Dizziness presents most related to changes in positioning/posture, however PT evaluation inconclusive at this time, now recommending cardiology/neuro input if attending physician see cogent.   Mobility Assessment:  Functional mobility assessment demonstrates moderate-heavy weakness, the pt now requiring Mod-assist physical assistance for transfers, and unable to perform AMB at meaningful and safe distances, whereas the patient performs these independently at baseline, and is an independent community AMB. The patient is at high risk for falls as evidence by gait speed <1.23m/s, forward reach <5", and multiple LOB demonstrated throughout session. Since last admission, pt has decline even further, AMB 65ft 2WA while admitted, and today unable to tolerate bed to door.    Pt will benefit from skilled PT intervention to increase independence and safety with basic mobility in preparation for discharge to the venue listed below.        Follow Up Recommendations SNF;Supervision for mobility/OOB (Pt may not qualify due to observation status, in qhich case she she resume home health services inititated PTA. )    Equipment Recommendations  None recommended by PT    Recommendations for Other Services       Precautions / Restrictions Precautions Precautions: Fall Precaution Comments: worsening dizziness with standing  Restrictions Weight Bearing Restrictions: No      Mobility  Bed Mobility Overal bed mobility: Needs Assistance Bed Mobility: Supine to Sit     Supine to sit: HOB elevated;Supervision Sit to supine: Min assist;HOB elevated   General bed mobility comments: Assistance required due to weakness  Transfers Overall transfer level: Needs  assistance Equipment used: Rolling  walker (2 wheeled) Transfers: Sit to/from Stand Sit to Stand: Mod assist            Ambulation/Gait Ambulation/Gait assistance: Min assist Ambulation Distance (Feet): 3 Feet (bed to chair only; requires 2-3 minutes) Assistive device: Rolling walker (2 wheeled)     Gait velocity interpretation: <1.8 ft/sec, indicative of risk for recurrent falls General Gait Details: worsening dizziness, impending desire to return to seated position.   Stairs            Wheelchair Mobility    Modified Rankin (Stroke Patients Only)       Balance Overall balance assessment: No apparent balance deficits (not formally assessed) (unable to formerly assess due to weakness/progressive dizziness. )                                           Pertinent Vitals/Pain Pain Assessment: No/denies pain    Home Living Family/patient expects to be discharged to:: Private residence Living Arrangements: Alone Available Help at Discharge:  ([place]) Type of Home: House Home Access: Stairs to enter Entrance Stairs-Rails: Psychiatric nurse of Steps: 3 Home Layout: One level Home Equipment: Environmental consultant - 2 wheels;Cane - single point;Shower seat      Prior Function Level of Independence: Independent with assistive device(s)         Comments: drivers, makes groceries, comunity ambulator with RW; independent with ADL. No falls history      Hand Dominance   Dominant Hand: Right    Extremity/Trunk Assessment   Upper Extremity Assessment Upper Extremity Assessment: Generalized weakness;LUE deficits/detail LUE Deficits / Details: Broken Left arm several years ago, with chronic weaness and hypofunction.     Lower Extremity Assessment Lower Extremity Assessment: Generalized weakness    Cervical / Trunk Assessment Cervical / Trunk Assessment:  (kyphotic with severe cervical lordosis in stance)  Communication   Communication:  No difficulties  Cognition Arousal/Alertness: Awake/alert Behavior During Therapy: WFL for tasks assessed/performed Overall Cognitive Status: Within Functional Limits for tasks assessed                      General Comments      Exercises     Assessment/Plan    PT Assessment Patient needs continued PT services  PT Problem List Decreased activity tolerance;Decreased strength;Decreased mobility;Decreased balance          PT Treatment Interventions Gait training;Functional mobility training;Therapeutic exercise;Stair training;Balance training;Therapeutic activities;Patient/family education;Neuromuscular re-education    PT Goals (Current goals can be found in the Care Plan section)  Acute Rehab PT Goals Patient Stated Goal: To get stronger PT Goal Formulation: With patient Time For Goal Achievement: 11/24/16 Potential to Achieve Goals: Fair    Frequency Min 3X/week   Barriers to discharge Inaccessible home environment;Decreased caregiver support Pt unable to tolerate steps to enter/exit home; no able to tolerate meaningful AMB distance at this time.     Co-evaluation               End of Session Equipment Utilized During Treatment: Gait belt Activity Tolerance: Patient tolerated treatment well;Treatment limited secondary to medical complications (Comment) Patient left: with call bell/phone within reach;in chair Nurse Communication: Other (comment)    Functional Assessment Tool Used: Clinical judgment  Functional Limitation: Changing and maintaining body position Changing and Maintaining Body Position Current Status [id number]): At least 60 percent but less than 80 percent impaired,  limited or restricted Changing and Maintaining Body Position Goal Status [phone]): At least 60 percent but less than 80 percent impaired, limited or restricted    Time: 1009-1044 PT Time Calculation (min) (ACUTE ONLY): 35 min   Charges:   PT Evaluation $PT Eval Moderate  Complexity: 1 Procedure PT Treatments $Therapeutic Activity: 8-22 mins   PT G Codes:   PT G-Codes **NOT FOR INPATIENT CLASS** Functional Assessment Tool Used: Clinical judgment  Functional Limitation: Changing and maintaining body position Changing and Maintaining Body Position Current Status [id number]): At least 60 percent but less than 80 percent impaired, limited or restricted Changing and Maintaining Body Position Goal Status [id number]): At least 60 percent but less than 80 percent impaired, limited or restricted    Nesta Kimple C 11/07/2016, 11:16 AM  11:30 AM, 11/07/16 Etta Grandchild, PT, DPT Physical Therapist - [place] [phone] (mobile)

## 2016-11-07 NOTE — Evaluation (Signed)
Occupational Therapy Evaluation Patient Details Name: Taylor Giles MRN: [id number] DOB: 08-May-1928 Today's Date: 11/07/2016    History of Present Illness Taylor Giles  is a 83 y.o. female,  with history of hypertension, hypothyroidism, hypertension, chronic kidney disease stage III who came to the hospital with chief complaint of dizziness. Patient was recently discharged from the hospital with generalized weakness at that time her antihypertensive medications were changed. Patient was taking medications as prescribed and today noticed that she had an episode of dizziness in the bathroom. She denies passing out. She called friends who brought her to the ED.   Clinical Impression   Pt received in bed, nsg staff attempting to assist with pt pulling up undergarments while supine in bed. Pt reports she stands for donning clothing at home, mod assist required for supine to EOB transfer. Pt reports being dizzy, when questioned further reports a light headed sensation and immediately lays supine. On second attempt pt encouraged to stay sitting up versus laying back down, light headed sensation resolved shortly. Pt requiring mod assist for sit<>stand transfer and mod assist for donning undergarments x2 trials. Pt demonstrates significant weakness and increased need for assistance with ADL completion, as pt was independent PTA. Recommend SNF on discharge to improve safety and independence in functional tasks, if does not qualify recommend HHOT services and 24/7 supervision.     Follow Up Recommendations  SNF;Home health OT    Equipment Recommendations  None recommended by OT       Precautions / Restrictions Precautions Precautions: Fall Precaution Comments: Due to dizziness and BLE weakness Restrictions Weight Bearing Restrictions: No      Mobility Bed Mobility Overal bed mobility: Needs Assistance Bed Mobility: Supine to Sit;Sit to Supine     Supine to sit: Mod assist;HOB elevated Sit  to supine: Min assist;HOB elevated   General bed mobility comments: Assistance required due to weakness  Transfers Overall transfer level: Needs assistance Equipment used: 2 person hand held assist Transfers: Sit to/from Stand Sit to Stand: Min assist;+2 physical assistance                   ADL Overall ADL's : Needs assistance/impaired Eating/Feeding: Set up;Sitting                   Lower Body Dressing: Moderate assistance;+2 for physical assistance;Sit to/from stand Lower Body Dressing Details (indicate cue type and reason): Pt requiring mod assist for pulling up undergarments. Pt requiring min/mod assist +2 to manage undergarments while maintaining balance             Functional mobility during ADLs:  (not attempted due to dizziness)                 Pertinent Vitals/Pain Pain Assessment: No/denies pain     Hand Dominance Right   Extremity/Trunk Assessment Upper Extremity Assessment Upper Extremity Assessment: Generalized weakness   Lower Extremity Assessment Lower Extremity Assessment: Defer to PT evaluation       Communication Communication Communication: No difficulties   Cognition Arousal/Alertness: Awake/alert Behavior During Therapy: WFL for tasks assessed/performed Overall Cognitive Status: Within Functional Limits for tasks assessed                                Home Living   Living Arrangements: Alone Available Help at Discharge: Family;Available PRN/intermittently Type of Home: House  Bathroom Shower/Tub: Teacher, early years/pre: Standard     Home Equipment: Environmental consultant - 2 wheels;Cane - single point;Shower seat          Prior Functioning/Environment Level of Independence: Independent with assistive device(s)        Comments: Pt uses RW or SPC for functional mobility        OT Problem List: Decreased strength;Decreased activity tolerance;Impaired balance (sitting and/or  standing);Decreased safety awareness   OT Treatment/Interventions: Self-care/ADL training;Therapeutic exercise;Therapeutic activities;Patient/family education    OT Goals(Current goals can be found in the care plan section) Acute Rehab OT Goals Patient Stated Goal: To get stronger  OT Frequency: Min 2X/week    End of Session Equipment Utilized During Treatment: Gait belt  Activity Tolerance: Patient tolerated treatment well Patient left: in bed;with call bell/phone within reach;with bed alarm set   Time: [id number] OT Time Calculation (min): 19 min Charges:  OT General Charges $OT Visit: 1 Procedure OT Evaluation $OT Eval Low Complexity: 1 Procedure G-Codes: OT G-codes **NOT FOR INPATIENT CLASS** Functional Assessment Tool Used: clinical judgement Functional Limitation: Self care Self Care Current Status [id number]): At least 40 percent but less than 60 percent impaired, limited or restricted Self Care Goal Status [id number]): At least 40 percent but less than 60 percent impaired, limited or restricted   Guadelupe Sabin, OTR/L  [phone] 11/07/2016, 10:12 AM

## 2016-11-09 DIAGNOSIS — R600 Localized edema: Secondary | ICD-10-CM | POA: Diagnosis not present

## 2016-11-09 DIAGNOSIS — R32 Unspecified urinary incontinence: Secondary | ICD-10-CM | POA: Diagnosis not present

## 2016-11-09 DIAGNOSIS — N390 Urinary tract infection, site not specified: Secondary | ICD-10-CM | POA: Diagnosis not present

## 2016-11-09 DIAGNOSIS — I129 Hypertensive chronic kidney disease with stage 1 through stage 4 chronic kidney disease, or unspecified chronic kidney disease: Secondary | ICD-10-CM | POA: Diagnosis not present

## 2016-11-09 DIAGNOSIS — E039 Hypothyroidism, unspecified: Secondary | ICD-10-CM | POA: Diagnosis not present

## 2016-11-09 DIAGNOSIS — M6281 Muscle weakness (generalized): Secondary | ICD-10-CM | POA: Diagnosis not present

## 2016-11-09 DIAGNOSIS — N183 Chronic kidney disease, stage 3 (moderate): Secondary | ICD-10-CM | POA: Diagnosis not present

## 2016-11-09 DIAGNOSIS — E785 Hyperlipidemia, unspecified: Secondary | ICD-10-CM | POA: Diagnosis not present

## 2016-11-10 DIAGNOSIS — N390 Urinary tract infection, site not specified: Secondary | ICD-10-CM | POA: Diagnosis not present

## 2016-11-10 DIAGNOSIS — R600 Localized edema: Secondary | ICD-10-CM | POA: Diagnosis not present

## 2016-11-10 DIAGNOSIS — E039 Hypothyroidism, unspecified: Secondary | ICD-10-CM | POA: Diagnosis not present

## 2016-11-10 DIAGNOSIS — R32 Unspecified urinary incontinence: Secondary | ICD-10-CM | POA: Diagnosis not present

## 2016-11-10 DIAGNOSIS — I129 Hypertensive chronic kidney disease with stage 1 through stage 4 chronic kidney disease, or unspecified chronic kidney disease: Secondary | ICD-10-CM | POA: Diagnosis not present

## 2016-11-10 DIAGNOSIS — E785 Hyperlipidemia, unspecified: Secondary | ICD-10-CM | POA: Diagnosis not present

## 2016-11-10 DIAGNOSIS — M6281 Muscle weakness (generalized): Secondary | ICD-10-CM | POA: Diagnosis not present

## 2016-11-10 DIAGNOSIS — N183 Chronic kidney disease, stage 3 (moderate): Secondary | ICD-10-CM | POA: Diagnosis not present

## 2016-11-13 DIAGNOSIS — N183 Chronic kidney disease, stage 3 (moderate): Secondary | ICD-10-CM | POA: Diagnosis not present

## 2016-11-13 DIAGNOSIS — M6281 Muscle weakness (generalized): Secondary | ICD-10-CM | POA: Diagnosis not present

## 2016-11-13 DIAGNOSIS — E785 Hyperlipidemia, unspecified: Secondary | ICD-10-CM | POA: Diagnosis not present

## 2016-11-13 DIAGNOSIS — E039 Hypothyroidism, unspecified: Secondary | ICD-10-CM | POA: Diagnosis not present

## 2016-11-13 DIAGNOSIS — N390 Urinary tract infection, site not specified: Secondary | ICD-10-CM | POA: Diagnosis not present

## 2016-11-13 DIAGNOSIS — R32 Unspecified urinary incontinence: Secondary | ICD-10-CM | POA: Diagnosis not present

## 2016-11-13 DIAGNOSIS — R600 Localized edema: Secondary | ICD-10-CM | POA: Diagnosis not present

## 2016-11-13 DIAGNOSIS — I129 Hypertensive chronic kidney disease with stage 1 through stage 4 chronic kidney disease, or unspecified chronic kidney disease: Secondary | ICD-10-CM | POA: Diagnosis not present

## 2016-11-15 DIAGNOSIS — R32 Unspecified urinary incontinence: Secondary | ICD-10-CM | POA: Diagnosis not present

## 2016-11-15 DIAGNOSIS — E785 Hyperlipidemia, unspecified: Secondary | ICD-10-CM | POA: Diagnosis not present

## 2016-11-15 DIAGNOSIS — R600 Localized edema: Secondary | ICD-10-CM | POA: Diagnosis not present

## 2016-11-15 DIAGNOSIS — E039 Hypothyroidism, unspecified: Secondary | ICD-10-CM | POA: Diagnosis not present

## 2016-11-15 DIAGNOSIS — N390 Urinary tract infection, site not specified: Secondary | ICD-10-CM | POA: Diagnosis not present

## 2016-11-15 DIAGNOSIS — M6281 Muscle weakness (generalized): Secondary | ICD-10-CM | POA: Diagnosis not present

## 2016-11-15 DIAGNOSIS — N183 Chronic kidney disease, stage 3 (moderate): Secondary | ICD-10-CM | POA: Diagnosis not present

## 2016-11-15 DIAGNOSIS — I129 Hypertensive chronic kidney disease with stage 1 through stage 4 chronic kidney disease, or unspecified chronic kidney disease: Secondary | ICD-10-CM | POA: Diagnosis not present

## 2016-11-16 ENCOUNTER — Ambulatory Visit (HOSPITAL_COMMUNITY)
Admission: RE | Admit: 2016-11-16 | Discharge: 2016-11-16 | Disposition: A | Discharge status: Home / Self Care | Payer: Medicare HMO | Source: Ambulatory Visit | Attending: Family Medicine | Admitting: Family Medicine

## 2016-11-16 ENCOUNTER — Other Ambulatory Visit (HOSPITAL_COMMUNITY): Payer: Self-pay | Admitting: Family Medicine

## 2016-11-16 DIAGNOSIS — N3289 Other specified disorders of bladder: Secondary | ICD-10-CM | POA: Diagnosis not present

## 2016-11-16 DIAGNOSIS — E782 Mixed hyperlipidemia: Secondary | ICD-10-CM | POA: Diagnosis not present

## 2016-11-16 DIAGNOSIS — R410 Disorientation, unspecified: Secondary | ICD-10-CM | POA: Diagnosis not present

## 2016-11-16 DIAGNOSIS — R600 Localized edema: Secondary | ICD-10-CM | POA: Diagnosis not present

## 2016-11-16 DIAGNOSIS — R404 Transient alteration of awareness: Secondary | ICD-10-CM | POA: Diagnosis not present

## 2016-11-16 DIAGNOSIS — D72829 Elevated white blood cell count, unspecified: Secondary | ICD-10-CM | POA: Diagnosis not present

## 2016-11-16 DIAGNOSIS — E039 Hypothyroidism, unspecified: Secondary | ICD-10-CM | POA: Diagnosis not present

## 2016-11-16 DIAGNOSIS — Z87440 Personal history of urinary (tract) infections: Secondary | ICD-10-CM | POA: Diagnosis not present

## 2016-11-16 DIAGNOSIS — E038 Other specified hypothyroidism: Secondary | ICD-10-CM | POA: Diagnosis not present

## 2016-11-16 DIAGNOSIS — R41841 Cognitive communication deficit: Secondary | ICD-10-CM | POA: Diagnosis not present

## 2016-11-16 DIAGNOSIS — R32 Unspecified urinary incontinence: Secondary | ICD-10-CM | POA: Diagnosis not present

## 2016-11-16 DIAGNOSIS — E785 Hyperlipidemia, unspecified: Secondary | ICD-10-CM | POA: Diagnosis not present

## 2016-11-16 DIAGNOSIS — S99922A Unspecified injury of left foot, initial encounter: Secondary | ICD-10-CM | POA: Diagnosis not present

## 2016-11-16 DIAGNOSIS — M25572 Pain in left ankle and joints of left foot: Secondary | ICD-10-CM

## 2016-11-16 DIAGNOSIS — N342 Other urethritis: Secondary | ICD-10-CM | POA: Diagnosis not present

## 2016-11-16 DIAGNOSIS — R531 Weakness: Secondary | ICD-10-CM | POA: Diagnosis not present

## 2016-11-16 DIAGNOSIS — R42 Dizziness and giddiness: Secondary | ICD-10-CM | POA: Diagnosis not present

## 2016-11-16 DIAGNOSIS — S99912A Unspecified injury of left ankle, initial encounter: Secondary | ICD-10-CM | POA: Diagnosis not present

## 2016-11-16 DIAGNOSIS — N390 Urinary tract infection, site not specified: Secondary | ICD-10-CM | POA: Diagnosis not present

## 2016-11-16 DIAGNOSIS — M79605 Pain in left leg: Secondary | ICD-10-CM | POA: Diagnosis not present

## 2016-11-16 DIAGNOSIS — I10 Essential (primary) hypertension: Secondary | ICD-10-CM | POA: Diagnosis not present

## 2016-11-16 DIAGNOSIS — I129 Hypertensive chronic kidney disease with stage 1 through stage 4 chronic kidney disease, or unspecified chronic kidney disease: Secondary | ICD-10-CM | POA: Diagnosis not present

## 2016-11-16 DIAGNOSIS — Z681 Body mass index (BMI) 19 or less, adult: Secondary | ICD-10-CM | POA: Diagnosis not present

## 2016-11-16 DIAGNOSIS — N183 Chronic kidney disease, stage 3 (moderate): Secondary | ICD-10-CM | POA: Diagnosis not present

## 2016-11-16 DIAGNOSIS — E063 Autoimmune thyroiditis: Secondary | ICD-10-CM | POA: Diagnosis not present

## 2016-11-16 DIAGNOSIS — M6281 Muscle weakness (generalized): Secondary | ICD-10-CM | POA: Diagnosis not present

## 2016-11-16 DIAGNOSIS — J159 Unspecified bacterial pneumonia: Secondary | ICD-10-CM | POA: Diagnosis not present

## 2016-11-16 DIAGNOSIS — M79672 Pain in left foot: Secondary | ICD-10-CM | POA: Diagnosis not present

## 2016-11-16 DIAGNOSIS — Z87891 Personal history of nicotine dependence: Secondary | ICD-10-CM | POA: Diagnosis not present

## 2016-11-16 DIAGNOSIS — R4182 Altered mental status, unspecified: Secondary | ICD-10-CM | POA: Diagnosis not present

## 2016-11-16 DIAGNOSIS — H8111 Benign paroxysmal vertigo, right ear: Secondary | ICD-10-CM | POA: Diagnosis not present

## 2016-11-16 DIAGNOSIS — R001 Bradycardia, unspecified: Secondary | ICD-10-CM | POA: Diagnosis not present

## 2016-11-18 DIAGNOSIS — E039 Hypothyroidism, unspecified: Secondary | ICD-10-CM | POA: Diagnosis not present

## 2016-11-18 DIAGNOSIS — R410 Disorientation, unspecified: Secondary | ICD-10-CM | POA: Diagnosis not present

## 2016-11-18 DIAGNOSIS — R531 Weakness: Secondary | ICD-10-CM | POA: Diagnosis not present

## 2016-11-18 DIAGNOSIS — N3289 Other specified disorders of bladder: Secondary | ICD-10-CM | POA: Diagnosis not present

## 2016-11-18 DIAGNOSIS — I10 Essential (primary) hypertension: Secondary | ICD-10-CM | POA: Diagnosis not present

## 2016-11-18 DIAGNOSIS — D72829 Elevated white blood cell count, unspecified: Secondary | ICD-10-CM | POA: Diagnosis not present

## 2016-11-19 DIAGNOSIS — E039 Hypothyroidism, unspecified: Secondary | ICD-10-CM | POA: Diagnosis not present

## 2016-11-19 DIAGNOSIS — D72829 Elevated white blood cell count, unspecified: Secondary | ICD-10-CM | POA: Diagnosis not present

## 2016-11-19 DIAGNOSIS — I10 Essential (primary) hypertension: Secondary | ICD-10-CM | POA: Diagnosis not present

## 2016-11-19 DIAGNOSIS — R410 Disorientation, unspecified: Secondary | ICD-10-CM | POA: Diagnosis not present

## 2016-11-19 DIAGNOSIS — R531 Weakness: Secondary | ICD-10-CM | POA: Diagnosis not present

## 2016-11-19 DIAGNOSIS — N3289 Other specified disorders of bladder: Secondary | ICD-10-CM | POA: Diagnosis not present

## 2016-11-28 DIAGNOSIS — E039 Hypothyroidism, unspecified: Secondary | ICD-10-CM | POA: Diagnosis not present

## 2016-11-28 DIAGNOSIS — I10 Essential (primary) hypertension: Secondary | ICD-10-CM | POA: Diagnosis not present

## 2016-11-28 DIAGNOSIS — N183 Chronic kidney disease, stage 3 (moderate): Secondary | ICD-10-CM | POA: Diagnosis not present

## 2016-12-03 DIAGNOSIS — E039 Hypothyroidism, unspecified: Secondary | ICD-10-CM | POA: Diagnosis not present

## 2016-12-03 DIAGNOSIS — R42 Dizziness and giddiness: Secondary | ICD-10-CM | POA: Diagnosis not present

## 2016-12-03 DIAGNOSIS — R410 Disorientation, unspecified: Secondary | ICD-10-CM | POA: Diagnosis not present

## 2016-12-03 DIAGNOSIS — R531 Weakness: Secondary | ICD-10-CM | POA: Diagnosis not present

## 2016-12-06 DIAGNOSIS — D72829 Elevated white blood cell count, unspecified: Secondary | ICD-10-CM | POA: Diagnosis not present

## 2016-12-06 DIAGNOSIS — H8111 Benign paroxysmal vertigo, right ear: Secondary | ICD-10-CM | POA: Diagnosis not present

## 2016-12-06 DIAGNOSIS — N3289 Other specified disorders of bladder: Secondary | ICD-10-CM | POA: Diagnosis not present

## 2016-12-06 DIAGNOSIS — E039 Hypothyroidism, unspecified: Secondary | ICD-10-CM | POA: Diagnosis not present

## 2016-12-10 DIAGNOSIS — D72829 Elevated white blood cell count, unspecified: Secondary | ICD-10-CM | POA: Diagnosis not present

## 2016-12-10 DIAGNOSIS — E039 Hypothyroidism, unspecified: Secondary | ICD-10-CM | POA: Diagnosis not present

## 2016-12-10 DIAGNOSIS — J159 Unspecified bacterial pneumonia: Secondary | ICD-10-CM | POA: Diagnosis not present

## 2016-12-10 DIAGNOSIS — H8111 Benign paroxysmal vertigo, right ear: Secondary | ICD-10-CM | POA: Diagnosis not present

## 2016-12-12 ENCOUNTER — Ambulatory Visit: Payer: Medicare HMO | Admitting: Physician Assistant

## 2016-12-13 DIAGNOSIS — D72829 Elevated white blood cell count, unspecified: Secondary | ICD-10-CM | POA: Diagnosis not present

## 2016-12-13 DIAGNOSIS — R42 Dizziness and giddiness: Secondary | ICD-10-CM | POA: Diagnosis not present

## 2016-12-13 DIAGNOSIS — J159 Unspecified bacterial pneumonia: Secondary | ICD-10-CM | POA: Diagnosis not present

## 2016-12-13 DIAGNOSIS — H8111 Benign paroxysmal vertigo, right ear: Secondary | ICD-10-CM | POA: Diagnosis not present

## 2016-12-13 DIAGNOSIS — E039 Hypothyroidism, unspecified: Secondary | ICD-10-CM | POA: Diagnosis not present

## 2016-12-16 NOTE — Progress Notes (Signed)
Cardiology Office Note    Date:  12/17/2016   ID:  Taylor Giles, DOB July 25, 1928, MRN [id number]  PCP:  Taylor Kilts, MD  Cardiologist: Dr. Debara Pickett  Chief Complaint  Patient presents with  . Follow-up    pt c/o dizziness    History of Present Illness:    Taylor Giles is a 83 y.o. female with past medical history of HTN, HLD, Stage 3 CKD, and 1st degree AV Block who presented to the office today for routine follow-up.   She was last seen by Dr. Debara Pickett in 03/2015 and reported doing well from a cardiac perspective at that time. Did report having an episode of unresponsiveness recently which occurred in the setting of a UTI. An echocardiogram had been performed which showed a normal EF of 55-60% with Grade 1 DD, mild TR, and an echodensity along the right atrium. A TEE was performed to further evaluate this which did not show evidence of a thrombus.   In reviewing records, she was recently admitted at [facility] from 2/2 - 10/30/2016 for generalized weakness. Possible etiology included UTI along with hypothyroidism as her TSH was greater than 40, therefore Synthroid dosing was adjusted. She was also found to have an AKI and be orthostatic, therefore Toprol-XL was decreased from 100mg  daily to 50mg  daily and Verapamil was decreased from 240mg  daily to 120mg  daily.   She was again admitted from 2/13 - 11/07/2016 for dizziness and an AKI, as creatinine was up to 1.65 from 1.37. Creatinine was improved to 1.19 the following morning following administration of IVF and she was later discharged with instructions to stop her Verapamil due to concerns that bradycardia were contributing to her initial presenting symptoms.  In talking with the patient today, she reports improvement in her dizziness since her recent hospitalization. Her niece who is present today reports she was treated for pneumonia earlier this month and also noticed improvement in her symptoms following treatment of this. She  remains at [facility] receiving PT/OT and was unable to participate in therapy during February and early March secondary to her symptoms, but is now participating in activities on a daily basis. Her niece also reports she was seen by an ENT specialist for her symptoms last week but nothing was changed in her treatment plan.  When her dizziness occurs, it can last throughout the day and is worse with positional changes. Reports feeling like the room is spinning. In reviewing records, it appears she has continued on Toprol-XL and Verapamil instead of stopping Verapamil in 10/2016.  She denies any chest pain, dyspnea with exertion, orthopnea, PND, lower extremity edema, or presyncope.    Past Medical History:  Diagnosis Date  . Anemia   . Arthritis   . Chronic kidney disease    due to use of votaren, sees Dr. Lowanda Foster  . Gall stones   . Hyperlipidemia   . Hypertension   . Hypothyroidism   . Melanoma ([place])    left elbow  s/p excision 1982  . Mitral valve prolapse   . Osteopenia   . PONV (postoperative nausea and vomiting)   . Syncope     Past Surgical History:  Procedure Laterality Date  . ANKLE FUSION  2009   Right  . APPENDECTOMY  1967   with removal of ovarian cyst  . BUNIONECTOMY     bilateral  . CATARACT EXTRACTION, BILATERAL    . CYSTOCELE REPAIR  12/11/2011   Procedure: ANTERIOR REPAIR (CYSTOCELE);  Surgeon: Jonnie Kind, MD;  Location: [facility];  Service: Gynecology;  Laterality: N/A;  . EXAMINATION UNDER ANESTHESIA  12/11/2011   Procedure: EXAM UNDER ANESTHESIA;  Surgeon: Jonnie Kind, MD;  Location: [facility];  Service: Gynecology;  Laterality: N/A;  . EXCISION MORTON'S NEUROMA     left?  . FOREIGN BODY REMOVAL VAGINAL  12/11/2011   Procedure: REMOVAL FOREIGN BODY VAGINAL;  Surgeon: Jonnie Kind, MD;  Location: [facility];  Service: Gynecology;  Laterality: N/A;  impacted pessary  . ORIF ELBOW FRACTURE  age 47   after fx at age 89, Left  . PARATHYROID  EXPLORATION  2004   removal of 2 adenoma tumors   . THYROID SURGERY  1950   removal of tumor, adenoma  . ULNAR NERVE TRANSPOSITION  age 17    Current Medications: Outpatient Medications Prior to Visit  Medication Sig Dispense Refill  . acetaminophen (TYLENOL) 325 MG tablet Take 1,300 mg by mouth every morning. And as needed    . Calcium Acetate 667 MG TABS Take 667 mg by mouth 3 (three) times daily.    . Cholecalciferol (VITAMIN D-3) 1000 UNITS CAPS Take 2,000 Units by mouth daily.     Marland Kitchen levothyroxine (SYNTHROID, LEVOTHROID) 150 MCG tablet Take 1 tablet (150 mcg total) by mouth daily before breakfast.    . meclizine (ANTIVERT) 12.5 MG tablet Take 12.5 mg by mouth 3 (three) times daily as needed for dizziness.    Marland Kitchen MYRBETRIQ 25 MG TB24 tablet Take 25 mg by mouth at bedtime.    . vitamin C (ASCORBIC ACID) 500 MG tablet Take 500 mg by mouth daily.    . Magnesium 250 MG TABS Take 250 mg by mouth at bedtime.    . metoprolol succinate (TOPROL-XL) 50 MG 24 hr tablet Take 1 tablet (50 mg total) by mouth daily. Take with or immediately following a meal. (Patient not taking: Reported on 12/17/2016)     No facility-administered medications prior to visit.      Allergies:   Gabapentin; Nsaids; Ciprofloxacin; Nitrofurantoin; and Terbinafine hcl   Social History   Social History  . Marital status: Widowed    Spouse name: N/A  . Number of children: 1  . Years of education: N/A   Occupational History  .  Retired   Social History Main Topics  . Smoking status: Former Smoker    Packs/day: 0.20    Years: 60.00    Quit date: 07/30/2014  . Smokeless tobacco: Never Used  . Alcohol use No  . Drug use: No  . Sexual activity: Not on file   Other Topics Concern  . Not on file   Social History Narrative  . No narrative on file     Family History:  The patient's family history includes Heart attack in her daughter and father; Hypertension in her brother and father; Kidney disease in her  brother; Stroke in her father.   Review of Systems:   Please see the history of present illness.     General:  No chills, fever, night sweats or weight changes. Positive for generalized fatigue.  Cardiovascular:  No chest pain, dyspnea on exertion, edema, orthopnea, palpitations, paroxysmal nocturnal dyspnea. Dermatological: No rash, lesions/masses Respiratory: No cough, dyspnea Urologic: No hematuria, dysuria Abdominal:   No nausea, vomiting, diarrhea, bright red blood per rectum, melena, or hematemesis Neurologic:  No visual changes, wkns, changes in mental status. Positive for dizziness.  All other systems reviewed and are otherwise negative except as  noted above.   Physical Exam:    VS:  BP 124/70 (BP Location: Right Arm, Patient Position: Sitting, Cuff Size: Normal)   Pulse 61   Ht 5\' 8"  (1.727 m)    General: Well developed, elderly Caucasian female, sitting in wheelchair, appearing in no acute distress. Head: Normocephalic, atraumatic, sclera non-icteric, no xanthomas, nares are without discharge.  Neck: No carotid bruits. JVD not elevated.  Lungs: Respirations regular and unlabored, without wheezes or rales.  Heart: Regular rate and rhythm. No S3 or S4.  No murmur, no rubs, or gallops appreciated. Abdomen: Soft, non-tender, non-distended with normoactive bowel sounds. No hepatomegaly. No rebound/guarding. No obvious abdominal masses. Msk:  Strength and tone appear normal for age. No joint deformities or effusions. Extremities: No clubbing or cyanosis. No edema.  Distal pedal pulses are 2+ bilaterally. Left ankle brace in place.  Neuro: Alert and oriented X 3. Moves all extremities spontaneously. No focal deficits noted. Psych:  Responds to questions appropriately with a normal affect. Skin: No rashes or lesions noted  Wt Readings from Last 3 Encounters:  11/07/16 156 lb 1.4 oz (70.8 kg)  10/30/16 154 lb 15 oz (70.3 kg)  03/30/15 142 lb 11.2 oz (64.7 kg)     Studies/Labs  Reviewed:   EKG:  EKG is not ordered today.  EKG from 11/06/2016 reviewed which showed NSR, HR 65, with no acute ST or T-wave changes when compared to prior tracings.   Recent Labs: 10/27/2016: TSH 40.260 11/07/2016: ALT 17; BUN 20; Creatinine, Ser 1.19; Hemoglobin 11.7; Platelets 245; Potassium 3.9; Sodium 140   Lipid Panel No results found for: CHOL, TRIG, HDL, CHOLHDL, VLDL, LDLCALC, LDLDIRECT  Additional studies/ records that were reviewed today include:   Echocardiogram: 10/2014 Summary:  1. Global left ventricular wall motion and contractility are within normal limits. 2. The EF is estimated at 55-60%. 3. Abnormal left ventricular diastolic filling is observed, consistent with impaired LV relaxation(Stage I). 4. The left atrial chamber size is normal. 5. The right ventricular global systolic function is normal. 6. Echodensity noted on right atrium which appears to be attaChed to lateral wall of Right atrium and is mobile;this could represent clot vs artifact. 7. There is mild tricuspid regurgitation. 8. There is evidence of mild pulmonary hypertension.  Recommendation: 1. Consider TEE if clinically indicated   TEE: 10/2014 Summary:  1. Global left ventricular wall motion and contractility are within normal limits. 2. The EF is estimated at 55-60%. 3. No thrombus is visualized within the left atrial appendage. 4. Prominent ridge seen in right atrium, possible prominent right atrial appendage.  5. Anomylous pulmonary vein return seen coming into right atrium. 6. Pericardial calcification with small pericardial effusion seen. 7. There is mild mitral regurgitation observed. 8. There is mild to moderate tricuspid regurgitation.  Assessment:    1. Dizziness   2. Orthostatic hypertension   3. Bradycardia   4. Hyperlipidemia, unspecified hyperlipidemia type   5. CKD (chronic kidney disease) stage 3, GFR 30-59 ml/min      Plan:   In order of problems listed  above:  1. Dizziness/ Orthostatic Hypotension - unknown etiology, thought to be secondary to orthostatic hypotension, bradycardia, and/or BPPV in the past.  - symptoms were worsened during prior UTI's and recent PNA. Still reports frequent episodes of dizziness which can last throughout the day and is worse with positional changes. Reports feeling like the room is spinning. Recently seen by ENT with no adjustments to her treatment plan per the patient's niece.  -  in reviewing records, she has continued on Verapamil although this was discontinued during a recent admission. Will stop Verapamil and continue on Toprol-XL 50mg  daily as she does have a history of known bradycardia and orthostatic hypotension. I have asked for her HR and BP to be checked at minimum 3 times per week while at [facility] with these recent medication changes.   2. Bradycardia - previous history of HR in 40's - 50's. In the low-60's during today's encounter.  - Toprol-XL was recently decreased from 100mg  daily to 50mg  daily with Verapamil being discontinued but the patient reports still taking this. Will stop Verapamil as above.   3. HLD - followed by PCP. Not currently on statin therapy.  4. Stage 3 CKD - baseline creatinine 1.1 - 1.3. - stable at 1.19 on 11/07/2016  Medication Adjustments/Labs and Tests Ordered: Current medicines are reviewed at length with the patient today.  Concerns regarding medicines are outlined above.  Medication changes, Labs and Tests ordered today are listed in the Patient Instructions below. Patient Instructions  Medication Instructions:  STOP Verapmil CONTINUE Metoprolol 50mg  Take 1 tablet once a day  Labwork: None   Testing/Procedures: None   Follow-Up: Your physician wants you to follow-up in: 6 months with DR HILTY. You will receive a reminder letter in the mail two months in advance. If you don't receive a letter, please call our office to schedule the follow-up  appointment.  Any Other Special Instructions Will Be Listed Below (If Applicable).  If you need a refill on your cardiac medications before your next appointment, please call your pharmacy.   Weston Brass Erma Heritage, [place]  12/17/2016 5:38 PM    [facility] [place], [place], [place]  [address] Phone: [phone]; Fax: [phone]  [address] [place], [place] [address] Phone: [phone]

## 2016-12-17 ENCOUNTER — Ambulatory Visit (INDEPENDENT_AMBULATORY_CARE_PROVIDER_SITE_OTHER): Payer: Medicare HMO | Admitting: Student

## 2016-12-17 ENCOUNTER — Encounter: Payer: Self-pay | Admitting: Student

## 2016-12-17 VITALS — BP 124/70 | HR 61 | Ht 68.0 in

## 2016-12-17 DIAGNOSIS — I10 Essential (primary) hypertension: Secondary | ICD-10-CM | POA: Insufficient documentation

## 2016-12-17 DIAGNOSIS — E785 Hyperlipidemia, unspecified: Secondary | ICD-10-CM | POA: Diagnosis not present

## 2016-12-17 DIAGNOSIS — D72829 Elevated white blood cell count, unspecified: Secondary | ICD-10-CM | POA: Diagnosis not present

## 2016-12-17 DIAGNOSIS — R001 Bradycardia, unspecified: Secondary | ICD-10-CM | POA: Diagnosis not present

## 2016-12-17 DIAGNOSIS — R42 Dizziness and giddiness: Secondary | ICD-10-CM | POA: Diagnosis not present

## 2016-12-17 DIAGNOSIS — R531 Weakness: Secondary | ICD-10-CM | POA: Diagnosis not present

## 2016-12-17 DIAGNOSIS — N183 Chronic kidney disease, stage 3 unspecified: Secondary | ICD-10-CM | POA: Insufficient documentation

## 2016-12-17 DIAGNOSIS — H8111 Benign paroxysmal vertigo, right ear: Secondary | ICD-10-CM | POA: Diagnosis not present

## 2016-12-17 DIAGNOSIS — J159 Unspecified bacterial pneumonia: Secondary | ICD-10-CM | POA: Diagnosis not present

## 2016-12-17 MED ORDER — METOPROLOL SUCCINATE ER 50 MG PO TB24: 50.0000 mg | ORAL_TABLET | Freq: Every day | ORAL | Status: AC

## 2016-12-17 NOTE — Patient Instructions (Signed)
Medication Instructions:  STOP Verapmil CONTINUE Metoprolol 50mg  Take 1 tablet once a day  Labwork: None   Testing/Procedures: None   Follow-Up: Your physician wants you to follow-up in: 6 months with DR HILTY. You will receive a reminder letter in the mail two months in advance. If you don't receive a letter, please call our office to schedule the follow-up appointment.  Any Other Special Instructions Will Be Listed Below (If Applicable).  If you need a refill on your cardiac medications before your next appointment, please call your pharmacy.

## 2016-12-20 DIAGNOSIS — D72829 Elevated white blood cell count, unspecified: Secondary | ICD-10-CM | POA: Diagnosis not present

## 2016-12-20 DIAGNOSIS — J159 Unspecified bacterial pneumonia: Secondary | ICD-10-CM | POA: Diagnosis not present

## 2016-12-20 DIAGNOSIS — H8111 Benign paroxysmal vertigo, right ear: Secondary | ICD-10-CM | POA: Diagnosis not present

## 2016-12-20 DIAGNOSIS — E039 Hypothyroidism, unspecified: Secondary | ICD-10-CM | POA: Diagnosis not present

## 2016-12-24 DIAGNOSIS — E039 Hypothyroidism, unspecified: Secondary | ICD-10-CM | POA: Diagnosis not present

## 2016-12-24 DIAGNOSIS — J159 Unspecified bacterial pneumonia: Secondary | ICD-10-CM | POA: Diagnosis not present

## 2016-12-24 DIAGNOSIS — D72829 Elevated white blood cell count, unspecified: Secondary | ICD-10-CM | POA: Diagnosis not present

## 2016-12-24 DIAGNOSIS — H8111 Benign paroxysmal vertigo, right ear: Secondary | ICD-10-CM | POA: Diagnosis not present

## 2016-12-26 DIAGNOSIS — R531 Weakness: Secondary | ICD-10-CM | POA: Diagnosis not present

## 2016-12-26 DIAGNOSIS — N183 Chronic kidney disease, stage 3 (moderate): Secondary | ICD-10-CM | POA: Diagnosis not present

## 2016-12-26 DIAGNOSIS — E782 Mixed hyperlipidemia: Secondary | ICD-10-CM | POA: Diagnosis not present

## 2016-12-26 DIAGNOSIS — E063 Autoimmune thyroiditis: Secondary | ICD-10-CM | POA: Diagnosis not present

## 2016-12-26 DIAGNOSIS — I10 Essential (primary) hypertension: Secondary | ICD-10-CM | POA: Diagnosis not present

## 2016-12-26 DIAGNOSIS — Z681 Body mass index (BMI) 19 or less, adult: Secondary | ICD-10-CM | POA: Diagnosis not present

## 2016-12-26 DIAGNOSIS — N342 Other urethritis: Secondary | ICD-10-CM | POA: Diagnosis not present

## 2017-01-05 DIAGNOSIS — Z87440 Personal history of urinary (tract) infections: Secondary | ICD-10-CM | POA: Diagnosis not present

## 2017-01-05 DIAGNOSIS — Z87891 Personal history of nicotine dependence: Secondary | ICD-10-CM | POA: Diagnosis not present

## 2017-01-05 DIAGNOSIS — R4182 Altered mental status, unspecified: Secondary | ICD-10-CM | POA: Diagnosis not present

## 2017-01-05 DIAGNOSIS — I10 Essential (primary) hypertension: Secondary | ICD-10-CM | POA: Diagnosis not present

## 2017-01-05 DIAGNOSIS — R41841 Cognitive communication deficit: Secondary | ICD-10-CM | POA: Diagnosis not present

## 2017-01-06 DIAGNOSIS — M6281 Muscle weakness (generalized): Secondary | ICD-10-CM | POA: Diagnosis not present

## 2017-01-06 DIAGNOSIS — L89620 Pressure ulcer of left heel, unstageable: Secondary | ICD-10-CM | POA: Diagnosis not present

## 2017-01-06 DIAGNOSIS — R4182 Altered mental status, unspecified: Secondary | ICD-10-CM | POA: Diagnosis not present

## 2017-01-06 DIAGNOSIS — R41841 Cognitive communication deficit: Secondary | ICD-10-CM | POA: Diagnosis not present

## 2017-01-06 DIAGNOSIS — N390 Urinary tract infection, site not specified: Secondary | ICD-10-CM | POA: Diagnosis not present

## 2017-01-07 DIAGNOSIS — R531 Weakness: Secondary | ICD-10-CM | POA: Diagnosis not present

## 2017-01-07 DIAGNOSIS — E119 Type 2 diabetes mellitus without complications: Secondary | ICD-10-CM | POA: Diagnosis not present

## 2017-01-07 DIAGNOSIS — N390 Urinary tract infection, site not specified: Secondary | ICD-10-CM | POA: Diagnosis not present

## 2017-01-07 DIAGNOSIS — R410 Disorientation, unspecified: Secondary | ICD-10-CM | POA: Diagnosis not present

## 2017-01-23 DIAGNOSIS — R809 Proteinuria, unspecified: Secondary | ICD-10-CM | POA: Diagnosis not present

## 2017-01-23 DIAGNOSIS — E039 Hypothyroidism, unspecified: Secondary | ICD-10-CM | POA: Diagnosis not present

## 2017-01-23 DIAGNOSIS — D649 Anemia, unspecified: Secondary | ICD-10-CM | POA: Diagnosis not present

## 2017-01-23 DIAGNOSIS — M6281 Muscle weakness (generalized): Secondary | ICD-10-CM | POA: Diagnosis not present

## 2017-01-23 DIAGNOSIS — N390 Urinary tract infection, site not specified: Secondary | ICD-10-CM | POA: Diagnosis not present

## 2017-01-23 DIAGNOSIS — E038 Other specified hypothyroidism: Secondary | ICD-10-CM | POA: Diagnosis not present

## 2017-01-28 DIAGNOSIS — D72829 Elevated white blood cell count, unspecified: Secondary | ICD-10-CM | POA: Diagnosis not present

## 2017-01-28 DIAGNOSIS — D649 Anemia, unspecified: Secondary | ICD-10-CM | POA: Diagnosis not present

## 2017-02-04 DIAGNOSIS — D649 Anemia, unspecified: Secondary | ICD-10-CM | POA: Diagnosis not present

## 2017-02-04 DIAGNOSIS — D72829 Elevated white blood cell count, unspecified: Secondary | ICD-10-CM | POA: Diagnosis not present

## 2017-02-07 DIAGNOSIS — L22 Diaper dermatitis: Secondary | ICD-10-CM | POA: Diagnosis not present

## 2017-02-07 DIAGNOSIS — R531 Weakness: Secondary | ICD-10-CM | POA: Diagnosis not present

## 2017-02-07 DIAGNOSIS — I10 Essential (primary) hypertension: Secondary | ICD-10-CM | POA: Diagnosis not present

## 2017-02-11 DIAGNOSIS — D649 Anemia, unspecified: Secondary | ICD-10-CM | POA: Diagnosis not present

## 2017-02-11 DIAGNOSIS — D72829 Elevated white blood cell count, unspecified: Secondary | ICD-10-CM | POA: Diagnosis not present

## 2017-02-14 DIAGNOSIS — E039 Hypothyroidism, unspecified: Secondary | ICD-10-CM | POA: Diagnosis not present

## 2017-02-14 DIAGNOSIS — H8111 Benign paroxysmal vertigo, right ear: Secondary | ICD-10-CM | POA: Diagnosis not present

## 2017-02-14 DIAGNOSIS — E119 Type 2 diabetes mellitus without complications: Secondary | ICD-10-CM | POA: Diagnosis not present

## 2017-02-14 DIAGNOSIS — L22 Diaper dermatitis: Secondary | ICD-10-CM | POA: Diagnosis not present

## 2017-02-15 DIAGNOSIS — E039 Hypothyroidism, unspecified: Secondary | ICD-10-CM | POA: Diagnosis not present

## 2017-02-15 DIAGNOSIS — D649 Anemia, unspecified: Secondary | ICD-10-CM | POA: Diagnosis not present

## 2017-02-15 DIAGNOSIS — M6281 Muscle weakness (generalized): Secondary | ICD-10-CM | POA: Diagnosis not present

## 2017-02-15 DIAGNOSIS — I10 Essential (primary) hypertension: Secondary | ICD-10-CM | POA: Diagnosis not present

## 2017-02-18 DIAGNOSIS — D649 Anemia, unspecified: Secondary | ICD-10-CM | POA: Diagnosis not present

## 2017-02-18 DIAGNOSIS — D72829 Elevated white blood cell count, unspecified: Secondary | ICD-10-CM | POA: Diagnosis not present

## 2017-02-22 DIAGNOSIS — L03115 Cellulitis of right lower limb: Secondary | ICD-10-CM | POA: Diagnosis not present

## 2017-02-22 DIAGNOSIS — M6281 Muscle weakness (generalized): Secondary | ICD-10-CM | POA: Diagnosis not present

## 2017-02-22 DIAGNOSIS — L89620 Pressure ulcer of left heel, unstageable: Secondary | ICD-10-CM | POA: Diagnosis not present

## 2017-02-22 DIAGNOSIS — M86371 Chronic multifocal osteomyelitis, right ankle and foot: Secondary | ICD-10-CM | POA: Diagnosis not present

## 2017-02-25 DIAGNOSIS — D649 Anemia, unspecified: Secondary | ICD-10-CM | POA: Diagnosis not present

## 2017-02-25 DIAGNOSIS — D72829 Elevated white blood cell count, unspecified: Secondary | ICD-10-CM | POA: Diagnosis not present

## 2017-02-26 DIAGNOSIS — R531 Weakness: Secondary | ICD-10-CM | POA: Diagnosis not present

## 2017-02-26 DIAGNOSIS — I10 Essential (primary) hypertension: Secondary | ICD-10-CM | POA: Diagnosis not present

## 2017-02-26 DIAGNOSIS — E119 Type 2 diabetes mellitus without complications: Secondary | ICD-10-CM | POA: Diagnosis not present

## 2017-02-26 DIAGNOSIS — L22 Diaper dermatitis: Secondary | ICD-10-CM | POA: Diagnosis not present

## 2017-02-27 DIAGNOSIS — R6889 Other general symptoms and signs: Secondary | ICD-10-CM | POA: Diagnosis not present

## 2017-02-27 DIAGNOSIS — E119 Type 2 diabetes mellitus without complications: Secondary | ICD-10-CM | POA: Diagnosis not present

## 2017-03-04 DIAGNOSIS — E119 Type 2 diabetes mellitus without complications: Secondary | ICD-10-CM | POA: Diagnosis not present

## 2017-03-04 DIAGNOSIS — L259 Unspecified contact dermatitis, unspecified cause: Secondary | ICD-10-CM | POA: Diagnosis not present

## 2017-03-04 DIAGNOSIS — R32 Unspecified urinary incontinence: Secondary | ICD-10-CM | POA: Diagnosis not present

## 2017-03-04 DIAGNOSIS — E038 Other specified hypothyroidism: Secondary | ICD-10-CM | POA: Diagnosis not present

## 2017-03-04 DIAGNOSIS — D649 Anemia, unspecified: Secondary | ICD-10-CM | POA: Diagnosis not present

## 2017-03-04 DIAGNOSIS — Z79899 Other long term (current) drug therapy: Secondary | ICD-10-CM | POA: Diagnosis not present

## 2017-03-04 DIAGNOSIS — L89610 Pressure ulcer of right heel, unstageable: Secondary | ICD-10-CM | POA: Diagnosis not present

## 2017-03-04 DIAGNOSIS — I10 Essential (primary) hypertension: Secondary | ICD-10-CM | POA: Diagnosis not present

## 2017-03-05 DIAGNOSIS — R262 Difficulty in walking, not elsewhere classified: Secondary | ICD-10-CM | POA: Diagnosis not present

## 2017-03-05 DIAGNOSIS — E038 Other specified hypothyroidism: Secondary | ICD-10-CM | POA: Diagnosis not present

## 2017-03-05 DIAGNOSIS — E119 Type 2 diabetes mellitus without complications: Secondary | ICD-10-CM | POA: Diagnosis not present

## 2017-03-05 DIAGNOSIS — E755 Other lipid storage disorders: Secondary | ICD-10-CM | POA: Diagnosis not present

## 2017-03-05 DIAGNOSIS — M24562 Contracture, left knee: Secondary | ICD-10-CM | POA: Diagnosis not present

## 2017-03-05 DIAGNOSIS — M24561 Contracture, right knee: Secondary | ICD-10-CM | POA: Diagnosis not present

## 2017-03-05 DIAGNOSIS — Z79899 Other long term (current) drug therapy: Secondary | ICD-10-CM | POA: Diagnosis not present

## 2017-03-05 DIAGNOSIS — M6281 Muscle weakness (generalized): Secondary | ICD-10-CM | POA: Diagnosis not present

## 2017-03-05 DIAGNOSIS — I10 Essential (primary) hypertension: Secondary | ICD-10-CM | POA: Diagnosis not present

## 2017-03-06 DIAGNOSIS — R262 Difficulty in walking, not elsewhere classified: Secondary | ICD-10-CM | POA: Diagnosis not present

## 2017-03-06 DIAGNOSIS — I10 Essential (primary) hypertension: Secondary | ICD-10-CM | POA: Diagnosis not present

## 2017-03-06 DIAGNOSIS — M6281 Muscle weakness (generalized): Secondary | ICD-10-CM | POA: Diagnosis not present

## 2017-03-06 DIAGNOSIS — M24562 Contracture, left knee: Secondary | ICD-10-CM | POA: Diagnosis not present

## 2017-03-06 DIAGNOSIS — E119 Type 2 diabetes mellitus without complications: Secondary | ICD-10-CM | POA: Diagnosis not present

## 2017-03-06 DIAGNOSIS — E8809 Other disorders of plasma-protein metabolism, not elsewhere classified: Secondary | ICD-10-CM | POA: Diagnosis not present

## 2017-03-06 DIAGNOSIS — M24561 Contracture, right knee: Secondary | ICD-10-CM | POA: Diagnosis not present

## 2017-03-06 DIAGNOSIS — R69 Illness, unspecified: Secondary | ICD-10-CM | POA: Diagnosis not present

## 2017-03-06 DIAGNOSIS — R4189 Other symptoms and signs involving cognitive functions and awareness: Secondary | ICD-10-CM | POA: Diagnosis not present

## 2017-03-06 DIAGNOSIS — R3915 Urgency of urination: Secondary | ICD-10-CM | POA: Diagnosis not present

## 2017-03-08 ENCOUNTER — Ambulatory Visit: Payer: Medicare HMO | Admitting: Urology

## 2017-03-08 DIAGNOSIS — M6281 Muscle weakness (generalized): Secondary | ICD-10-CM | POA: Diagnosis not present

## 2017-03-08 DIAGNOSIS — M24562 Contracture, left knee: Secondary | ICD-10-CM | POA: Diagnosis not present

## 2017-03-08 DIAGNOSIS — E119 Type 2 diabetes mellitus without complications: Secondary | ICD-10-CM | POA: Diagnosis not present

## 2017-03-08 DIAGNOSIS — M24561 Contracture, right knee: Secondary | ICD-10-CM | POA: Diagnosis not present

## 2017-03-08 DIAGNOSIS — I10 Essential (primary) hypertension: Secondary | ICD-10-CM | POA: Diagnosis not present

## 2017-03-08 DIAGNOSIS — R262 Difficulty in walking, not elsewhere classified: Secondary | ICD-10-CM | POA: Diagnosis not present

## 2017-03-11 DIAGNOSIS — I10 Essential (primary) hypertension: Secondary | ICD-10-CM | POA: Diagnosis not present

## 2017-03-11 DIAGNOSIS — N390 Urinary tract infection, site not specified: Secondary | ICD-10-CM | POA: Diagnosis not present

## 2017-03-11 DIAGNOSIS — M6281 Muscle weakness (generalized): Secondary | ICD-10-CM | POA: Diagnosis not present

## 2017-03-11 DIAGNOSIS — M24562 Contracture, left knee: Secondary | ICD-10-CM | POA: Diagnosis not present

## 2017-03-11 DIAGNOSIS — D649 Anemia, unspecified: Secondary | ICD-10-CM | POA: Diagnosis not present

## 2017-03-11 DIAGNOSIS — B952 Enterococcus as the cause of diseases classified elsewhere: Secondary | ICD-10-CM | POA: Diagnosis not present

## 2017-03-11 DIAGNOSIS — E119 Type 2 diabetes mellitus without complications: Secondary | ICD-10-CM | POA: Diagnosis not present

## 2017-03-11 DIAGNOSIS — R262 Difficulty in walking, not elsewhere classified: Secondary | ICD-10-CM | POA: Diagnosis not present

## 2017-03-11 DIAGNOSIS — R4189 Other symptoms and signs involving cognitive functions and awareness: Secondary | ICD-10-CM | POA: Diagnosis not present

## 2017-03-11 DIAGNOSIS — M24561 Contracture, right knee: Secondary | ICD-10-CM | POA: Diagnosis not present

## 2017-03-12 DIAGNOSIS — R262 Difficulty in walking, not elsewhere classified: Secondary | ICD-10-CM | POA: Diagnosis not present

## 2017-03-12 DIAGNOSIS — E119 Type 2 diabetes mellitus without complications: Secondary | ICD-10-CM | POA: Diagnosis not present

## 2017-03-12 DIAGNOSIS — M24561 Contracture, right knee: Secondary | ICD-10-CM | POA: Diagnosis not present

## 2017-03-12 DIAGNOSIS — M24562 Contracture, left knee: Secondary | ICD-10-CM | POA: Diagnosis not present

## 2017-03-12 DIAGNOSIS — M6281 Muscle weakness (generalized): Secondary | ICD-10-CM | POA: Diagnosis not present

## 2017-03-12 DIAGNOSIS — I10 Essential (primary) hypertension: Secondary | ICD-10-CM | POA: Diagnosis not present

## 2017-03-13 DIAGNOSIS — M6281 Muscle weakness (generalized): Secondary | ICD-10-CM | POA: Diagnosis not present

## 2017-03-13 DIAGNOSIS — I10 Essential (primary) hypertension: Secondary | ICD-10-CM | POA: Diagnosis not present

## 2017-03-13 DIAGNOSIS — M24561 Contracture, right knee: Secondary | ICD-10-CM | POA: Diagnosis not present

## 2017-03-13 DIAGNOSIS — E119 Type 2 diabetes mellitus without complications: Secondary | ICD-10-CM | POA: Diagnosis not present

## 2017-03-13 DIAGNOSIS — M24562 Contracture, left knee: Secondary | ICD-10-CM | POA: Diagnosis not present

## 2017-03-13 DIAGNOSIS — R262 Difficulty in walking, not elsewhere classified: Secondary | ICD-10-CM | POA: Diagnosis not present

## 2017-03-14 DIAGNOSIS — M6281 Muscle weakness (generalized): Secondary | ICD-10-CM | POA: Diagnosis not present

## 2017-03-14 DIAGNOSIS — M24562 Contracture, left knee: Secondary | ICD-10-CM | POA: Diagnosis not present

## 2017-03-14 DIAGNOSIS — I10 Essential (primary) hypertension: Secondary | ICD-10-CM | POA: Diagnosis not present

## 2017-03-14 DIAGNOSIS — M24561 Contracture, right knee: Secondary | ICD-10-CM | POA: Diagnosis not present

## 2017-03-14 DIAGNOSIS — R262 Difficulty in walking, not elsewhere classified: Secondary | ICD-10-CM | POA: Diagnosis not present

## 2017-03-14 DIAGNOSIS — E119 Type 2 diabetes mellitus without complications: Secondary | ICD-10-CM | POA: Diagnosis not present

## 2017-03-15 DIAGNOSIS — M6281 Muscle weakness (generalized): Secondary | ICD-10-CM | POA: Diagnosis not present

## 2017-03-15 DIAGNOSIS — M24562 Contracture, left knee: Secondary | ICD-10-CM | POA: Diagnosis not present

## 2017-03-15 DIAGNOSIS — R262 Difficulty in walking, not elsewhere classified: Secondary | ICD-10-CM | POA: Diagnosis not present

## 2017-03-15 DIAGNOSIS — M24561 Contracture, right knee: Secondary | ICD-10-CM | POA: Diagnosis not present

## 2017-03-15 DIAGNOSIS — I10 Essential (primary) hypertension: Secondary | ICD-10-CM | POA: Diagnosis not present

## 2017-03-15 DIAGNOSIS — E119 Type 2 diabetes mellitus without complications: Secondary | ICD-10-CM | POA: Diagnosis not present

## 2017-03-18 DIAGNOSIS — E119 Type 2 diabetes mellitus without complications: Secondary | ICD-10-CM | POA: Diagnosis not present

## 2017-03-18 DIAGNOSIS — M6281 Muscle weakness (generalized): Secondary | ICD-10-CM | POA: Diagnosis not present

## 2017-03-18 DIAGNOSIS — I10 Essential (primary) hypertension: Secondary | ICD-10-CM | POA: Diagnosis not present

## 2017-03-18 DIAGNOSIS — M24561 Contracture, right knee: Secondary | ICD-10-CM | POA: Diagnosis not present

## 2017-03-18 DIAGNOSIS — L259 Unspecified contact dermatitis, unspecified cause: Secondary | ICD-10-CM | POA: Diagnosis not present

## 2017-03-18 DIAGNOSIS — M24562 Contracture, left knee: Secondary | ICD-10-CM | POA: Diagnosis not present

## 2017-03-18 DIAGNOSIS — L89610 Pressure ulcer of right heel, unstageable: Secondary | ICD-10-CM | POA: Diagnosis not present

## 2017-03-18 DIAGNOSIS — R262 Difficulty in walking, not elsewhere classified: Secondary | ICD-10-CM | POA: Diagnosis not present

## 2017-03-19 DIAGNOSIS — R262 Difficulty in walking, not elsewhere classified: Secondary | ICD-10-CM | POA: Diagnosis not present

## 2017-03-19 DIAGNOSIS — M24562 Contracture, left knee: Secondary | ICD-10-CM | POA: Diagnosis not present

## 2017-03-19 DIAGNOSIS — E119 Type 2 diabetes mellitus without complications: Secondary | ICD-10-CM | POA: Diagnosis not present

## 2017-03-19 DIAGNOSIS — M24561 Contracture, right knee: Secondary | ICD-10-CM | POA: Diagnosis not present

## 2017-03-19 DIAGNOSIS — I10 Essential (primary) hypertension: Secondary | ICD-10-CM | POA: Diagnosis not present

## 2017-03-19 DIAGNOSIS — M6281 Muscle weakness (generalized): Secondary | ICD-10-CM | POA: Diagnosis not present

## 2017-03-20 DIAGNOSIS — M24562 Contracture, left knee: Secondary | ICD-10-CM | POA: Diagnosis not present

## 2017-03-20 DIAGNOSIS — I10 Essential (primary) hypertension: Secondary | ICD-10-CM | POA: Diagnosis not present

## 2017-03-20 DIAGNOSIS — R262 Difficulty in walking, not elsewhere classified: Secondary | ICD-10-CM | POA: Diagnosis not present

## 2017-03-20 DIAGNOSIS — M6281 Muscle weakness (generalized): Secondary | ICD-10-CM | POA: Diagnosis not present

## 2017-03-20 DIAGNOSIS — E119 Type 2 diabetes mellitus without complications: Secondary | ICD-10-CM | POA: Diagnosis not present

## 2017-03-20 DIAGNOSIS — M24561 Contracture, right knee: Secondary | ICD-10-CM | POA: Diagnosis not present

## 2017-03-21 DIAGNOSIS — M6281 Muscle weakness (generalized): Secondary | ICD-10-CM | POA: Diagnosis not present

## 2017-03-21 DIAGNOSIS — R262 Difficulty in walking, not elsewhere classified: Secondary | ICD-10-CM | POA: Diagnosis not present

## 2017-03-21 DIAGNOSIS — G92 Toxic encephalopathy: Secondary | ICD-10-CM | POA: Diagnosis not present

## 2017-03-21 DIAGNOSIS — F329 Major depressive disorder, single episode, unspecified: Secondary | ICD-10-CM | POA: Diagnosis not present

## 2017-03-21 DIAGNOSIS — M24561 Contracture, right knee: Secondary | ICD-10-CM | POA: Diagnosis not present

## 2017-03-21 DIAGNOSIS — M24562 Contracture, left knee: Secondary | ICD-10-CM | POA: Diagnosis not present

## 2017-03-21 DIAGNOSIS — E119 Type 2 diabetes mellitus without complications: Secondary | ICD-10-CM | POA: Diagnosis not present

## 2017-03-21 DIAGNOSIS — R5381 Other malaise: Secondary | ICD-10-CM | POA: Diagnosis not present

## 2017-03-21 DIAGNOSIS — R419 Unspecified symptoms and signs involving cognitive functions and awareness: Secondary | ICD-10-CM | POA: Diagnosis not present

## 2017-03-21 DIAGNOSIS — I10 Essential (primary) hypertension: Secondary | ICD-10-CM | POA: Diagnosis not present

## 2017-03-22 DIAGNOSIS — M24561 Contracture, right knee: Secondary | ICD-10-CM | POA: Diagnosis not present

## 2017-03-22 DIAGNOSIS — M24562 Contracture, left knee: Secondary | ICD-10-CM | POA: Diagnosis not present

## 2017-03-22 DIAGNOSIS — R262 Difficulty in walking, not elsewhere classified: Secondary | ICD-10-CM | POA: Diagnosis not present

## 2017-03-22 DIAGNOSIS — M6281 Muscle weakness (generalized): Secondary | ICD-10-CM | POA: Diagnosis not present

## 2017-03-22 DIAGNOSIS — E119 Type 2 diabetes mellitus without complications: Secondary | ICD-10-CM | POA: Diagnosis not present

## 2017-03-22 DIAGNOSIS — I10 Essential (primary) hypertension: Secondary | ICD-10-CM | POA: Diagnosis not present

## 2017-03-25 DIAGNOSIS — L89610 Pressure ulcer of right heel, unstageable: Secondary | ICD-10-CM | POA: Diagnosis not present

## 2017-03-25 DIAGNOSIS — M6281 Muscle weakness (generalized): Secondary | ICD-10-CM | POA: Diagnosis not present

## 2017-03-25 DIAGNOSIS — M24562 Contracture, left knee: Secondary | ICD-10-CM | POA: Diagnosis not present

## 2017-03-25 DIAGNOSIS — R262 Difficulty in walking, not elsewhere classified: Secondary | ICD-10-CM | POA: Diagnosis not present

## 2017-03-25 DIAGNOSIS — R69 Illness, unspecified: Secondary | ICD-10-CM | POA: Diagnosis not present

## 2017-03-25 DIAGNOSIS — I10 Essential (primary) hypertension: Secondary | ICD-10-CM | POA: Diagnosis not present

## 2017-03-25 DIAGNOSIS — E038 Other specified hypothyroidism: Secondary | ICD-10-CM | POA: Diagnosis not present

## 2017-03-25 DIAGNOSIS — E119 Type 2 diabetes mellitus without complications: Secondary | ICD-10-CM | POA: Diagnosis not present

## 2017-03-25 DIAGNOSIS — M24561 Contracture, right knee: Secondary | ICD-10-CM | POA: Diagnosis not present

## 2017-03-26 DIAGNOSIS — Z7984 Long term (current) use of oral hypoglycemic drugs: Secondary | ICD-10-CM | POA: Diagnosis not present

## 2017-03-26 DIAGNOSIS — M2042 Other hammer toe(s) (acquired), left foot: Secondary | ICD-10-CM | POA: Diagnosis not present

## 2017-03-26 DIAGNOSIS — R600 Localized edema: Secondary | ICD-10-CM | POA: Diagnosis not present

## 2017-03-26 DIAGNOSIS — M24561 Contracture, right knee: Secondary | ICD-10-CM | POA: Diagnosis not present

## 2017-03-26 DIAGNOSIS — M2012 Hallux valgus (acquired), left foot: Secondary | ICD-10-CM | POA: Diagnosis not present

## 2017-03-26 DIAGNOSIS — E782 Mixed hyperlipidemia: Secondary | ICD-10-CM | POA: Diagnosis not present

## 2017-03-26 DIAGNOSIS — I10 Essential (primary) hypertension: Secondary | ICD-10-CM | POA: Diagnosis not present

## 2017-03-26 DIAGNOSIS — E119 Type 2 diabetes mellitus without complications: Secondary | ICD-10-CM | POA: Diagnosis not present

## 2017-03-26 DIAGNOSIS — B351 Tinea unguium: Secondary | ICD-10-CM | POA: Diagnosis not present

## 2017-03-26 DIAGNOSIS — M6281 Muscle weakness (generalized): Secondary | ICD-10-CM | POA: Diagnosis not present

## 2017-03-26 DIAGNOSIS — R262 Difficulty in walking, not elsewhere classified: Secondary | ICD-10-CM | POA: Diagnosis not present

## 2017-03-26 DIAGNOSIS — M2041 Other hammer toe(s) (acquired), right foot: Secondary | ICD-10-CM | POA: Diagnosis not present

## 2017-03-26 DIAGNOSIS — M24562 Contracture, left knee: Secondary | ICD-10-CM | POA: Diagnosis not present

## 2017-03-26 DIAGNOSIS — M2011 Hallux valgus (acquired), right foot: Secondary | ICD-10-CM | POA: Diagnosis not present

## 2017-03-27 DIAGNOSIS — M24562 Contracture, left knee: Secondary | ICD-10-CM | POA: Diagnosis not present

## 2017-03-27 DIAGNOSIS — M6281 Muscle weakness (generalized): Secondary | ICD-10-CM | POA: Diagnosis not present

## 2017-03-27 DIAGNOSIS — R262 Difficulty in walking, not elsewhere classified: Secondary | ICD-10-CM | POA: Diagnosis not present

## 2017-03-27 DIAGNOSIS — E119 Type 2 diabetes mellitus without complications: Secondary | ICD-10-CM | POA: Diagnosis not present

## 2017-03-27 DIAGNOSIS — I10 Essential (primary) hypertension: Secondary | ICD-10-CM | POA: Diagnosis not present

## 2017-03-27 DIAGNOSIS — M24561 Contracture, right knee: Secondary | ICD-10-CM | POA: Diagnosis not present

## 2017-03-28 DIAGNOSIS — R410 Disorientation, unspecified: Secondary | ICD-10-CM | POA: Diagnosis not present

## 2017-03-28 DIAGNOSIS — R41841 Cognitive communication deficit: Secondary | ICD-10-CM | POA: Diagnosis not present

## 2017-03-28 DIAGNOSIS — M24562 Contracture, left knee: Secondary | ICD-10-CM | POA: Diagnosis not present

## 2017-03-28 DIAGNOSIS — M24561 Contracture, right knee: Secondary | ICD-10-CM | POA: Diagnosis not present

## 2017-03-28 DIAGNOSIS — R69 Illness, unspecified: Secondary | ICD-10-CM | POA: Diagnosis not present

## 2017-03-28 DIAGNOSIS — M6281 Muscle weakness (generalized): Secondary | ICD-10-CM | POA: Diagnosis not present

## 2017-03-28 DIAGNOSIS — E119 Type 2 diabetes mellitus without complications: Secondary | ICD-10-CM | POA: Diagnosis not present

## 2017-03-28 DIAGNOSIS — I10 Essential (primary) hypertension: Secondary | ICD-10-CM | POA: Diagnosis not present

## 2017-03-28 DIAGNOSIS — R262 Difficulty in walking, not elsewhere classified: Secondary | ICD-10-CM | POA: Diagnosis not present

## 2017-03-29 DIAGNOSIS — M24562 Contracture, left knee: Secondary | ICD-10-CM | POA: Diagnosis not present

## 2017-03-29 DIAGNOSIS — M6281 Muscle weakness (generalized): Secondary | ICD-10-CM | POA: Diagnosis not present

## 2017-03-29 DIAGNOSIS — I10 Essential (primary) hypertension: Secondary | ICD-10-CM | POA: Diagnosis not present

## 2017-03-29 DIAGNOSIS — E119 Type 2 diabetes mellitus without complications: Secondary | ICD-10-CM | POA: Diagnosis not present

## 2017-03-29 DIAGNOSIS — M24561 Contracture, right knee: Secondary | ICD-10-CM | POA: Diagnosis not present

## 2017-03-29 DIAGNOSIS — R262 Difficulty in walking, not elsewhere classified: Secondary | ICD-10-CM | POA: Diagnosis not present

## 2017-03-29 DIAGNOSIS — N390 Urinary tract infection, site not specified: Secondary | ICD-10-CM | POA: Diagnosis not present

## 2017-03-31 DIAGNOSIS — I10 Essential (primary) hypertension: Secondary | ICD-10-CM | POA: Diagnosis not present

## 2017-03-31 DIAGNOSIS — N183 Chronic kidney disease, stage 3 (moderate): Secondary | ICD-10-CM | POA: Diagnosis not present

## 2017-03-31 DIAGNOSIS — E11628 Type 2 diabetes mellitus with other skin complications: Secondary | ICD-10-CM | POA: Diagnosis not present

## 2017-03-31 DIAGNOSIS — N178 Other acute kidney failure: Secondary | ICD-10-CM | POA: Diagnosis not present

## 2017-03-31 DIAGNOSIS — E1169 Type 2 diabetes mellitus with other specified complication: Secondary | ICD-10-CM | POA: Diagnosis not present

## 2017-03-31 DIAGNOSIS — E039 Hypothyroidism, unspecified: Secondary | ICD-10-CM | POA: Diagnosis not present

## 2017-03-31 DIAGNOSIS — M86671 Other chronic osteomyelitis, right ankle and foot: Secondary | ICD-10-CM | POA: Diagnosis not present

## 2017-03-31 DIAGNOSIS — E1143 Type 2 diabetes mellitus with diabetic autonomic (poly)neuropathy: Secondary | ICD-10-CM | POA: Diagnosis not present

## 2017-03-31 DIAGNOSIS — E118 Type 2 diabetes mellitus with unspecified complications: Secondary | ICD-10-CM | POA: Diagnosis not present

## 2017-03-31 DIAGNOSIS — L03115 Cellulitis of right lower limb: Secondary | ICD-10-CM | POA: Diagnosis not present

## 2017-03-31 DIAGNOSIS — R627 Adult failure to thrive: Secondary | ICD-10-CM | POA: Diagnosis not present

## 2017-03-31 DIAGNOSIS — S99911A Unspecified injury of right ankle, initial encounter: Secondary | ICD-10-CM | POA: Diagnosis not present

## 2017-03-31 DIAGNOSIS — L02415 Cutaneous abscess of right lower limb: Secondary | ICD-10-CM | POA: Diagnosis not present

## 2017-03-31 DIAGNOSIS — L02611 Cutaneous abscess of right foot: Secondary | ICD-10-CM | POA: Diagnosis not present

## 2017-03-31 DIAGNOSIS — I70203 Unspecified atherosclerosis of native arteries of extremities, bilateral legs: Secondary | ICD-10-CM | POA: Diagnosis not present

## 2017-03-31 DIAGNOSIS — L03031 Cellulitis of right toe: Secondary | ICD-10-CM | POA: Diagnosis not present

## 2017-03-31 DIAGNOSIS — M19071 Primary osteoarthritis, right ankle and foot: Secondary | ICD-10-CM | POA: Diagnosis not present

## 2017-03-31 DIAGNOSIS — M869 Osteomyelitis, unspecified: Secondary | ICD-10-CM | POA: Diagnosis not present

## 2017-03-31 DIAGNOSIS — Z792 Long term (current) use of antibiotics: Secondary | ICD-10-CM | POA: Diagnosis not present

## 2017-03-31 DIAGNOSIS — E1140 Type 2 diabetes mellitus with diabetic neuropathy, unspecified: Secondary | ICD-10-CM | POA: Diagnosis not present

## 2017-03-31 DIAGNOSIS — L0390 Cellulitis, unspecified: Secondary | ICD-10-CM | POA: Diagnosis not present

## 2017-03-31 DIAGNOSIS — M7989 Other specified soft tissue disorders: Secondary | ICD-10-CM | POA: Diagnosis not present

## 2017-03-31 DIAGNOSIS — L89619 Pressure ulcer of right heel, unspecified stage: Secondary | ICD-10-CM | POA: Diagnosis not present

## 2017-03-31 DIAGNOSIS — M86171 Other acute osteomyelitis, right ankle and foot: Secondary | ICD-10-CM | POA: Diagnosis not present

## 2017-03-31 DIAGNOSIS — N179 Acute kidney failure, unspecified: Secondary | ICD-10-CM | POA: Diagnosis not present

## 2017-03-31 DIAGNOSIS — J449 Chronic obstructive pulmonary disease, unspecified: Secondary | ICD-10-CM | POA: Diagnosis not present

## 2017-03-31 DIAGNOSIS — I129 Hypertensive chronic kidney disease with stage 1 through stage 4 chronic kidney disease, or unspecified chronic kidney disease: Secondary | ICD-10-CM | POA: Diagnosis not present

## 2017-03-31 DIAGNOSIS — D72829 Elevated white blood cell count, unspecified: Secondary | ICD-10-CM | POA: Diagnosis not present

## 2017-03-31 DIAGNOSIS — E1122 Type 2 diabetes mellitus with diabetic chronic kidney disease: Secondary | ICD-10-CM | POA: Diagnosis not present

## 2017-03-31 DIAGNOSIS — Z9889 Other specified postprocedural states: Secondary | ICD-10-CM | POA: Diagnosis not present

## 2017-03-31 DIAGNOSIS — Z794 Long term (current) use of insulin: Secondary | ICD-10-CM | POA: Diagnosis not present

## 2017-04-16 DIAGNOSIS — L03115 Cellulitis of right lower limb: Secondary | ICD-10-CM | POA: Diagnosis not present

## 2017-04-16 DIAGNOSIS — M86671 Other chronic osteomyelitis, right ankle and foot: Secondary | ICD-10-CM | POA: Diagnosis not present

## 2017-04-16 DIAGNOSIS — E1169 Type 2 diabetes mellitus with other specified complication: Secondary | ICD-10-CM | POA: Diagnosis not present

## 2017-04-16 DIAGNOSIS — E1122 Type 2 diabetes mellitus with diabetic chronic kidney disease: Secondary | ICD-10-CM | POA: Diagnosis not present

## 2017-04-16 DIAGNOSIS — N179 Acute kidney failure, unspecified: Secondary | ICD-10-CM | POA: Diagnosis not present

## 2017-04-16 DIAGNOSIS — L02415 Cutaneous abscess of right lower limb: Secondary | ICD-10-CM | POA: Diagnosis not present

## 2017-04-16 DIAGNOSIS — E11628 Type 2 diabetes mellitus with other skin complications: Secondary | ICD-10-CM | POA: Diagnosis not present

## 2017-04-16 DIAGNOSIS — N183 Chronic kidney disease, stage 3 (moderate): Secondary | ICD-10-CM | POA: Diagnosis not present

## 2017-04-16 DIAGNOSIS — E1143 Type 2 diabetes mellitus with diabetic autonomic (poly)neuropathy: Secondary | ICD-10-CM | POA: Diagnosis not present

## 2017-04-16 DIAGNOSIS — L02611 Cutaneous abscess of right foot: Secondary | ICD-10-CM | POA: Diagnosis not present

## 2017-04-17 DIAGNOSIS — M6281 Muscle weakness (generalized): Secondary | ICD-10-CM | POA: Diagnosis not present

## 2017-04-17 DIAGNOSIS — I10 Essential (primary) hypertension: Secondary | ICD-10-CM | POA: Diagnosis not present

## 2017-04-17 DIAGNOSIS — E559 Vitamin D deficiency, unspecified: Secondary | ICD-10-CM | POA: Diagnosis not present

## 2017-04-17 DIAGNOSIS — N178 Other acute kidney failure: Secondary | ICD-10-CM | POA: Diagnosis not present

## 2017-04-17 DIAGNOSIS — E119 Type 2 diabetes mellitus without complications: Secondary | ICD-10-CM | POA: Diagnosis not present

## 2017-04-17 DIAGNOSIS — E039 Hypothyroidism, unspecified: Secondary | ICD-10-CM | POA: Diagnosis not present

## 2017-04-17 DIAGNOSIS — L03115 Cellulitis of right lower limb: Secondary | ICD-10-CM | POA: Diagnosis not present

## 2017-04-18 DIAGNOSIS — L03115 Cellulitis of right lower limb: Secondary | ICD-10-CM | POA: Diagnosis not present

## 2017-04-18 DIAGNOSIS — R419 Unspecified symptoms and signs involving cognitive functions and awareness: Secondary | ICD-10-CM | POA: Diagnosis not present

## 2017-04-18 DIAGNOSIS — R5381 Other malaise: Secondary | ICD-10-CM | POA: Diagnosis not present

## 2017-04-18 DIAGNOSIS — R69 Illness, unspecified: Secondary | ICD-10-CM | POA: Diagnosis not present

## 2017-04-18 DIAGNOSIS — G92 Toxic encephalopathy: Secondary | ICD-10-CM | POA: Diagnosis not present

## 2017-04-18 DIAGNOSIS — G3184 Mild cognitive impairment, so stated: Secondary | ICD-10-CM | POA: Diagnosis not present

## 2017-04-18 DIAGNOSIS — M6281 Muscle weakness (generalized): Secondary | ICD-10-CM | POA: Diagnosis not present

## 2017-04-19 DIAGNOSIS — M6281 Muscle weakness (generalized): Secondary | ICD-10-CM | POA: Diagnosis not present

## 2017-04-19 DIAGNOSIS — L03115 Cellulitis of right lower limb: Secondary | ICD-10-CM | POA: Diagnosis not present

## 2017-04-22 DIAGNOSIS — M6281 Muscle weakness (generalized): Secondary | ICD-10-CM | POA: Diagnosis not present

## 2017-04-22 DIAGNOSIS — L97419 Non-pressure chronic ulcer of right heel and midfoot with unspecified severity: Secondary | ICD-10-CM | POA: Diagnosis not present

## 2017-04-22 DIAGNOSIS — L03115 Cellulitis of right lower limb: Secondary | ICD-10-CM | POA: Diagnosis not present

## 2017-04-23 DIAGNOSIS — L03115 Cellulitis of right lower limb: Secondary | ICD-10-CM | POA: Diagnosis not present

## 2017-04-23 DIAGNOSIS — M6281 Muscle weakness (generalized): Secondary | ICD-10-CM | POA: Diagnosis not present

## 2017-04-29 DIAGNOSIS — R4182 Altered mental status, unspecified: Secondary | ICD-10-CM | POA: Diagnosis not present

## 2017-04-29 DIAGNOSIS — L97419 Non-pressure chronic ulcer of right heel and midfoot with unspecified severity: Secondary | ICD-10-CM | POA: Diagnosis not present

## 2017-04-30 DIAGNOSIS — R946 Abnormal results of thyroid function studies: Secondary | ICD-10-CM | POA: Diagnosis not present

## 2017-05-01 DIAGNOSIS — I10 Essential (primary) hypertension: Secondary | ICD-10-CM | POA: Diagnosis not present

## 2017-05-01 DIAGNOSIS — B3749 Other urogenital candidiasis: Secondary | ICD-10-CM | POA: Diagnosis not present

## 2017-05-06 DIAGNOSIS — L853 Xerosis cutis: Secondary | ICD-10-CM | POA: Diagnosis not present

## 2017-05-07 DIAGNOSIS — R339 Retention of urine, unspecified: Secondary | ICD-10-CM | POA: Diagnosis not present

## 2017-05-16 DIAGNOSIS — G3184 Mild cognitive impairment, so stated: Secondary | ICD-10-CM | POA: Diagnosis not present

## 2017-05-16 DIAGNOSIS — R419 Unspecified symptoms and signs involving cognitive functions and awareness: Secondary | ICD-10-CM | POA: Diagnosis not present

## 2017-05-16 DIAGNOSIS — R5381 Other malaise: Secondary | ICD-10-CM | POA: Diagnosis not present

## 2017-05-16 DIAGNOSIS — R69 Illness, unspecified: Secondary | ICD-10-CM | POA: Diagnosis not present

## 2017-05-30 DIAGNOSIS — R419 Unspecified symptoms and signs involving cognitive functions and awareness: Secondary | ICD-10-CM | POA: Diagnosis not present

## 2017-05-30 DIAGNOSIS — G3184 Mild cognitive impairment, so stated: Secondary | ICD-10-CM | POA: Diagnosis not present

## 2017-05-30 DIAGNOSIS — R69 Illness, unspecified: Secondary | ICD-10-CM | POA: Diagnosis not present

## 2017-06-01 DIAGNOSIS — M25511 Pain in right shoulder: Secondary | ICD-10-CM | POA: Diagnosis not present

## 2017-06-02 DIAGNOSIS — R05 Cough: Secondary | ICD-10-CM | POA: Diagnosis not present

## 2017-06-03 DIAGNOSIS — J40 Bronchitis, not specified as acute or chronic: Secondary | ICD-10-CM | POA: Diagnosis not present

## 2017-06-03 DIAGNOSIS — I10 Essential (primary) hypertension: Secondary | ICD-10-CM | POA: Diagnosis not present

## 2017-06-03 DIAGNOSIS — M25512 Pain in left shoulder: Secondary | ICD-10-CM | POA: Diagnosis not present

## 2017-06-03 DIAGNOSIS — D649 Anemia, unspecified: Secondary | ICD-10-CM | POA: Diagnosis not present

## 2017-06-05 DIAGNOSIS — R69 Illness, unspecified: Secondary | ICD-10-CM | POA: Diagnosis not present

## 2017-06-05 DIAGNOSIS — E038 Other specified hypothyroidism: Secondary | ICD-10-CM | POA: Diagnosis not present

## 2017-06-05 DIAGNOSIS — I10 Essential (primary) hypertension: Secondary | ICD-10-CM | POA: Diagnosis not present

## 2017-06-05 DIAGNOSIS — J188 Other pneumonia, unspecified organism: Secondary | ICD-10-CM | POA: Diagnosis not present

## 2017-06-06 DIAGNOSIS — D649 Anemia, unspecified: Secondary | ICD-10-CM | POA: Diagnosis not present

## 2017-06-07 DIAGNOSIS — J40 Bronchitis, not specified as acute or chronic: Secondary | ICD-10-CM | POA: Diagnosis not present

## 2017-06-07 DIAGNOSIS — N178 Other acute kidney failure: Secondary | ICD-10-CM | POA: Diagnosis not present

## 2017-06-07 DIAGNOSIS — R198 Other specified symptoms and signs involving the digestive system and abdomen: Secondary | ICD-10-CM | POA: Diagnosis not present

## 2017-06-11 DIAGNOSIS — R7989 Other specified abnormal findings of blood chemistry: Secondary | ICD-10-CM | POA: Diagnosis not present

## 2017-06-24 DIAGNOSIS — R600 Localized edema: Secondary | ICD-10-CM | POA: Diagnosis not present

## 2017-06-24 DIAGNOSIS — M79604 Pain in right leg: Secondary | ICD-10-CM | POA: Diagnosis not present

## 2017-06-25 DIAGNOSIS — M79671 Pain in right foot: Secondary | ICD-10-CM | POA: Diagnosis not present

## 2017-06-26 DIAGNOSIS — L03115 Cellulitis of right lower limb: Secondary | ICD-10-CM | POA: Diagnosis not present

## 2017-06-26 DIAGNOSIS — I10 Essential (primary) hypertension: Secondary | ICD-10-CM | POA: Diagnosis not present

## 2017-06-26 DIAGNOSIS — L089 Local infection of the skin and subcutaneous tissue, unspecified: Secondary | ICD-10-CM | POA: Diagnosis not present

## 2017-07-01 DIAGNOSIS — M7981 Nontraumatic hematoma of soft tissue: Secondary | ICD-10-CM | POA: Diagnosis not present

## 2017-07-01 DIAGNOSIS — L089 Local infection of the skin and subcutaneous tissue, unspecified: Secondary | ICD-10-CM | POA: Diagnosis not present

## 2017-07-04 DIAGNOSIS — R58 Hemorrhage, not elsewhere classified: Secondary | ICD-10-CM | POA: Diagnosis not present

## 2017-07-04 DIAGNOSIS — L988 Other specified disorders of the skin and subcutaneous tissue: Secondary | ICD-10-CM | POA: Diagnosis not present

## 2017-07-08 DIAGNOSIS — L03115 Cellulitis of right lower limb: Secondary | ICD-10-CM | POA: Diagnosis not present

## 2017-07-08 DIAGNOSIS — M868X7 Other osteomyelitis, ankle and foot: Secondary | ICD-10-CM | POA: Diagnosis not present

## 2017-07-10 DIAGNOSIS — R0600 Dyspnea, unspecified: Secondary | ICD-10-CM | POA: Diagnosis not present

## 2017-07-10 DIAGNOSIS — E1169 Type 2 diabetes mellitus with other specified complication: Secondary | ICD-10-CM | POA: Diagnosis not present

## 2017-07-10 DIAGNOSIS — I129 Hypertensive chronic kidney disease with stage 1 through stage 4 chronic kidney disease, or unspecified chronic kidney disease: Secondary | ICD-10-CM | POA: Diagnosis not present

## 2017-07-10 DIAGNOSIS — M869 Osteomyelitis, unspecified: Secondary | ICD-10-CM | POA: Diagnosis not present

## 2017-07-10 DIAGNOSIS — M79609 Pain in unspecified limb: Secondary | ICD-10-CM | POA: Diagnosis not present

## 2017-07-10 DIAGNOSIS — M8618 Other acute osteomyelitis, other site: Secondary | ICD-10-CM | POA: Diagnosis not present

## 2017-07-10 DIAGNOSIS — E039 Hypothyroidism, unspecified: Secondary | ICD-10-CM | POA: Diagnosis not present

## 2017-07-10 DIAGNOSIS — M6281 Muscle weakness (generalized): Secondary | ICD-10-CM | POA: Diagnosis not present

## 2017-07-10 DIAGNOSIS — B351 Tinea unguium: Secondary | ICD-10-CM | POA: Diagnosis not present

## 2017-07-10 DIAGNOSIS — I10 Essential (primary) hypertension: Secondary | ICD-10-CM | POA: Diagnosis not present

## 2017-07-10 DIAGNOSIS — M7989 Other specified soft tissue disorders: Secondary | ICD-10-CM | POA: Diagnosis not present

## 2017-07-10 DIAGNOSIS — G309 Alzheimer's disease, unspecified: Secondary | ICD-10-CM | POA: Diagnosis not present

## 2017-07-10 DIAGNOSIS — E1151 Type 2 diabetes mellitus with diabetic peripheral angiopathy without gangrene: Secondary | ICD-10-CM | POA: Diagnosis not present

## 2017-07-10 DIAGNOSIS — M24542 Contracture, left hand: Secondary | ICD-10-CM | POA: Diagnosis not present

## 2017-07-10 DIAGNOSIS — E088 Diabetes mellitus due to underlying condition with unspecified complications: Secondary | ICD-10-CM | POA: Diagnosis not present

## 2017-07-10 DIAGNOSIS — M79622 Pain in left upper arm: Secondary | ICD-10-CM | POA: Diagnosis not present

## 2017-07-10 DIAGNOSIS — Z79890 Hormone replacement therapy (postmenopausal): Secondary | ICD-10-CM | POA: Diagnosis not present

## 2017-07-10 DIAGNOSIS — R4182 Altered mental status, unspecified: Secondary | ICD-10-CM | POA: Diagnosis not present

## 2017-07-10 DIAGNOSIS — R338 Other retention of urine: Secondary | ICD-10-CM | POA: Diagnosis not present

## 2017-07-10 DIAGNOSIS — M79661 Pain in right lower leg: Secondary | ICD-10-CM | POA: Diagnosis not present

## 2017-07-10 DIAGNOSIS — M79672 Pain in left foot: Secondary | ICD-10-CM | POA: Diagnosis not present

## 2017-07-10 DIAGNOSIS — Z7984 Long term (current) use of oral hypoglycemic drugs: Secondary | ICD-10-CM | POA: Diagnosis not present

## 2017-07-10 DIAGNOSIS — E1122 Type 2 diabetes mellitus with diabetic chronic kidney disease: Secondary | ICD-10-CM | POA: Diagnosis not present

## 2017-07-10 DIAGNOSIS — R509 Fever, unspecified: Secondary | ICD-10-CM | POA: Diagnosis not present

## 2017-07-10 DIAGNOSIS — M79642 Pain in left hand: Secondary | ICD-10-CM | POA: Diagnosis not present

## 2017-07-10 DIAGNOSIS — M86071 Acute hematogenous osteomyelitis, right ankle and foot: Secondary | ICD-10-CM | POA: Diagnosis not present

## 2017-07-10 DIAGNOSIS — M79671 Pain in right foot: Secondary | ICD-10-CM | POA: Diagnosis not present

## 2017-07-10 DIAGNOSIS — Z79899 Other long term (current) drug therapy: Secondary | ICD-10-CM | POA: Diagnosis not present

## 2017-07-10 DIAGNOSIS — M25522 Pain in left elbow: Secondary | ICD-10-CM | POA: Diagnosis not present

## 2017-07-10 DIAGNOSIS — R69 Illness, unspecified: Secondary | ICD-10-CM | POA: Diagnosis not present

## 2017-07-10 DIAGNOSIS — M86671 Other chronic osteomyelitis, right ankle and foot: Secondary | ICD-10-CM | POA: Diagnosis not present

## 2017-07-10 DIAGNOSIS — N182 Chronic kidney disease, stage 2 (mild): Secondary | ICD-10-CM | POA: Diagnosis not present

## 2017-07-10 DIAGNOSIS — Z89421 Acquired absence of other right toe(s): Secondary | ICD-10-CM | POA: Diagnosis not present

## 2017-07-10 DIAGNOSIS — N189 Chronic kidney disease, unspecified: Secondary | ICD-10-CM | POA: Diagnosis not present

## 2017-07-10 DIAGNOSIS — L03115 Cellulitis of right lower limb: Secondary | ICD-10-CM | POA: Diagnosis not present

## 2017-07-13 DIAGNOSIS — M79642 Pain in left hand: Secondary | ICD-10-CM | POA: Diagnosis not present

## 2017-07-13 DIAGNOSIS — M6281 Muscle weakness (generalized): Secondary | ICD-10-CM | POA: Diagnosis not present

## 2017-07-13 DIAGNOSIS — M24542 Contracture, left hand: Secondary | ICD-10-CM | POA: Diagnosis not present

## 2017-07-25 DIAGNOSIS — E038 Other specified hypothyroidism: Secondary | ICD-10-CM | POA: Diagnosis not present

## 2017-07-25 DIAGNOSIS — I10 Essential (primary) hypertension: Secondary | ICD-10-CM | POA: Diagnosis not present

## 2017-07-25 DIAGNOSIS — R69 Illness, unspecified: Secondary | ICD-10-CM | POA: Diagnosis not present

## 2017-07-29 DIAGNOSIS — M79671 Pain in right foot: Secondary | ICD-10-CM | POA: Diagnosis not present

## 2017-07-30 DIAGNOSIS — G3184 Mild cognitive impairment, so stated: Secondary | ICD-10-CM | POA: Diagnosis not present

## 2017-07-30 DIAGNOSIS — R5381 Other malaise: Secondary | ICD-10-CM | POA: Diagnosis not present

## 2017-07-30 DIAGNOSIS — R69 Illness, unspecified: Secondary | ICD-10-CM | POA: Diagnosis not present

## 2017-07-30 DIAGNOSIS — R419 Unspecified symptoms and signs involving cognitive functions and awareness: Secondary | ICD-10-CM | POA: Diagnosis not present

## 2017-08-05 DIAGNOSIS — M8668 Other chronic osteomyelitis, other site: Secondary | ICD-10-CM | POA: Diagnosis not present

## 2017-08-05 DIAGNOSIS — I10 Essential (primary) hypertension: Secondary | ICD-10-CM | POA: Diagnosis not present

## 2017-08-05 DIAGNOSIS — R69 Illness, unspecified: Secondary | ICD-10-CM | POA: Diagnosis not present

## 2017-08-05 DIAGNOSIS — E038 Other specified hypothyroidism: Secondary | ICD-10-CM | POA: Diagnosis not present

## 2017-08-06 DIAGNOSIS — G3184 Mild cognitive impairment, so stated: Secondary | ICD-10-CM | POA: Diagnosis not present

## 2017-08-06 DIAGNOSIS — R419 Unspecified symptoms and signs involving cognitive functions and awareness: Secondary | ICD-10-CM | POA: Diagnosis not present

## 2017-08-06 DIAGNOSIS — R69 Illness, unspecified: Secondary | ICD-10-CM | POA: Diagnosis not present

## 2017-08-06 DIAGNOSIS — R5381 Other malaise: Secondary | ICD-10-CM | POA: Diagnosis not present

## 2017-08-08 DIAGNOSIS — L821 Other seborrheic keratosis: Secondary | ICD-10-CM | POA: Diagnosis not present

## 2017-08-08 DIAGNOSIS — B078 Other viral warts: Secondary | ICD-10-CM | POA: Diagnosis not present

## 2017-08-08 DIAGNOSIS — Z85820 Personal history of malignant melanoma of skin: Secondary | ICD-10-CM | POA: Diagnosis not present

## 2017-08-08 DIAGNOSIS — L570 Actinic keratosis: Secondary | ICD-10-CM | POA: Diagnosis not present

## 2017-08-26 DIAGNOSIS — H6123 Impacted cerumen, bilateral: Secondary | ICD-10-CM | POA: Diagnosis not present

## 2017-08-26 DIAGNOSIS — M8668 Other chronic osteomyelitis, other site: Secondary | ICD-10-CM | POA: Diagnosis not present

## 2017-09-10 DIAGNOSIS — G3184 Mild cognitive impairment, so stated: Secondary | ICD-10-CM | POA: Diagnosis not present

## 2017-09-10 DIAGNOSIS — R419 Unspecified symptoms and signs involving cognitive functions and awareness: Secondary | ICD-10-CM | POA: Diagnosis not present

## 2017-09-10 DIAGNOSIS — R69 Illness, unspecified: Secondary | ICD-10-CM | POA: Diagnosis not present

## 2017-09-10 DIAGNOSIS — R5381 Other malaise: Secondary | ICD-10-CM | POA: Diagnosis not present

## 2017-09-20 DIAGNOSIS — M24542 Contracture, left hand: Secondary | ICD-10-CM | POA: Diagnosis not present

## 2017-09-20 DIAGNOSIS — M8668 Other chronic osteomyelitis, other site: Secondary | ICD-10-CM | POA: Diagnosis not present

## 2017-09-20 DIAGNOSIS — R69 Illness, unspecified: Secondary | ICD-10-CM | POA: Diagnosis not present

## 2017-09-23 DIAGNOSIS — M24542 Contracture, left hand: Secondary | ICD-10-CM | POA: Diagnosis not present

## 2017-09-23 DIAGNOSIS — M25512 Pain in left shoulder: Secondary | ICD-10-CM | POA: Diagnosis not present

## 2017-09-25 DIAGNOSIS — Z961 Presence of intraocular lens: Secondary | ICD-10-CM | POA: Diagnosis not present

## 2017-09-27 DIAGNOSIS — M79642 Pain in left hand: Secondary | ICD-10-CM | POA: Diagnosis not present

## 2017-09-27 DIAGNOSIS — M24542 Contracture, left hand: Secondary | ICD-10-CM | POA: Diagnosis not present

## 2017-09-27 DIAGNOSIS — E119 Type 2 diabetes mellitus without complications: Secondary | ICD-10-CM | POA: Diagnosis not present

## 2017-09-30 DIAGNOSIS — E119 Type 2 diabetes mellitus without complications: Secondary | ICD-10-CM | POA: Diagnosis not present

## 2017-09-30 DIAGNOSIS — M24542 Contracture, left hand: Secondary | ICD-10-CM | POA: Diagnosis not present

## 2017-09-30 DIAGNOSIS — M79642 Pain in left hand: Secondary | ICD-10-CM | POA: Diagnosis not present

## 2017-10-01 DIAGNOSIS — E119 Type 2 diabetes mellitus without complications: Secondary | ICD-10-CM | POA: Diagnosis not present

## 2017-10-01 DIAGNOSIS — M79642 Pain in left hand: Secondary | ICD-10-CM | POA: Diagnosis not present

## 2017-10-01 DIAGNOSIS — M24542 Contracture, left hand: Secondary | ICD-10-CM | POA: Diagnosis not present

## 2017-10-02 DIAGNOSIS — M79642 Pain in left hand: Secondary | ICD-10-CM | POA: Diagnosis not present

## 2017-10-02 DIAGNOSIS — M24542 Contracture, left hand: Secondary | ICD-10-CM | POA: Diagnosis not present

## 2017-10-02 DIAGNOSIS — E119 Type 2 diabetes mellitus without complications: Secondary | ICD-10-CM | POA: Diagnosis not present

## 2017-10-03 DIAGNOSIS — M25522 Pain in left elbow: Secondary | ICD-10-CM | POA: Diagnosis not present

## 2017-10-03 DIAGNOSIS — M79642 Pain in left hand: Secondary | ICD-10-CM | POA: Diagnosis not present

## 2017-10-03 DIAGNOSIS — E119 Type 2 diabetes mellitus without complications: Secondary | ICD-10-CM | POA: Diagnosis not present

## 2017-10-03 DIAGNOSIS — M24542 Contracture, left hand: Secondary | ICD-10-CM | POA: Diagnosis not present

## 2017-10-04 DIAGNOSIS — E119 Type 2 diabetes mellitus without complications: Secondary | ICD-10-CM | POA: Diagnosis not present

## 2017-10-04 DIAGNOSIS — I10 Essential (primary) hypertension: Secondary | ICD-10-CM | POA: Diagnosis not present

## 2017-10-04 DIAGNOSIS — M79642 Pain in left hand: Secondary | ICD-10-CM | POA: Diagnosis not present

## 2017-10-04 DIAGNOSIS — R69 Illness, unspecified: Secondary | ICD-10-CM | POA: Diagnosis not present

## 2017-10-04 DIAGNOSIS — M24542 Contracture, left hand: Secondary | ICD-10-CM | POA: Diagnosis not present

## 2017-10-04 DIAGNOSIS — E038 Other specified hypothyroidism: Secondary | ICD-10-CM | POA: Diagnosis not present

## 2017-10-07 DIAGNOSIS — M25512 Pain in left shoulder: Secondary | ICD-10-CM | POA: Diagnosis not present

## 2017-10-07 DIAGNOSIS — M6281 Muscle weakness (generalized): Secondary | ICD-10-CM | POA: Diagnosis not present

## 2017-10-07 DIAGNOSIS — I10 Essential (primary) hypertension: Secondary | ICD-10-CM | POA: Diagnosis not present

## 2017-10-07 DIAGNOSIS — M79642 Pain in left hand: Secondary | ICD-10-CM | POA: Diagnosis not present

## 2017-10-07 DIAGNOSIS — M24542 Contracture, left hand: Secondary | ICD-10-CM | POA: Diagnosis not present

## 2017-10-07 DIAGNOSIS — E119 Type 2 diabetes mellitus without complications: Secondary | ICD-10-CM | POA: Diagnosis not present

## 2017-10-07 DIAGNOSIS — E039 Hypothyroidism, unspecified: Secondary | ICD-10-CM | POA: Diagnosis not present

## 2017-10-08 DIAGNOSIS — M79642 Pain in left hand: Secondary | ICD-10-CM | POA: Diagnosis not present

## 2017-10-08 DIAGNOSIS — M24542 Contracture, left hand: Secondary | ICD-10-CM | POA: Diagnosis not present

## 2017-10-08 DIAGNOSIS — E119 Type 2 diabetes mellitus without complications: Secondary | ICD-10-CM | POA: Diagnosis not present

## 2017-10-09 DIAGNOSIS — M24542 Contracture, left hand: Secondary | ICD-10-CM | POA: Diagnosis not present

## 2017-10-09 DIAGNOSIS — E119 Type 2 diabetes mellitus without complications: Secondary | ICD-10-CM | POA: Diagnosis not present

## 2017-10-09 DIAGNOSIS — M79642 Pain in left hand: Secondary | ICD-10-CM | POA: Diagnosis not present

## 2017-10-10 DIAGNOSIS — M65342 Trigger finger, left ring finger: Secondary | ICD-10-CM | POA: Diagnosis not present

## 2017-10-10 DIAGNOSIS — M79642 Pain in left hand: Secondary | ICD-10-CM | POA: Diagnosis not present

## 2017-10-10 DIAGNOSIS — M65332 Trigger finger, left middle finger: Secondary | ICD-10-CM | POA: Diagnosis not present

## 2017-10-10 DIAGNOSIS — M24542 Contracture, left hand: Secondary | ICD-10-CM | POA: Diagnosis not present

## 2017-10-10 DIAGNOSIS — E119 Type 2 diabetes mellitus without complications: Secondary | ICD-10-CM | POA: Diagnosis not present

## 2017-10-10 DIAGNOSIS — S42425S Nondisplaced comminuted supracondylar fracture without intercondylar fracture of left humerus, sequela: Secondary | ICD-10-CM | POA: Diagnosis not present

## 2017-10-11 DIAGNOSIS — E119 Type 2 diabetes mellitus without complications: Secondary | ICD-10-CM | POA: Diagnosis not present

## 2017-10-11 DIAGNOSIS — M24542 Contracture, left hand: Secondary | ICD-10-CM | POA: Diagnosis not present

## 2017-10-11 DIAGNOSIS — M79642 Pain in left hand: Secondary | ICD-10-CM | POA: Diagnosis not present

## 2017-10-14 DIAGNOSIS — M79642 Pain in left hand: Secondary | ICD-10-CM | POA: Diagnosis not present

## 2017-10-14 DIAGNOSIS — E119 Type 2 diabetes mellitus without complications: Secondary | ICD-10-CM | POA: Diagnosis not present

## 2017-10-14 DIAGNOSIS — M24542 Contracture, left hand: Secondary | ICD-10-CM | POA: Diagnosis not present

## 2017-10-15 DIAGNOSIS — E119 Type 2 diabetes mellitus without complications: Secondary | ICD-10-CM | POA: Diagnosis not present

## 2017-10-15 DIAGNOSIS — M24542 Contracture, left hand: Secondary | ICD-10-CM | POA: Diagnosis not present

## 2017-10-15 DIAGNOSIS — M79642 Pain in left hand: Secondary | ICD-10-CM | POA: Diagnosis not present

## 2017-10-16 DIAGNOSIS — M79642 Pain in left hand: Secondary | ICD-10-CM | POA: Diagnosis not present

## 2017-10-16 DIAGNOSIS — E119 Type 2 diabetes mellitus without complications: Secondary | ICD-10-CM | POA: Diagnosis not present

## 2017-10-16 DIAGNOSIS — M24542 Contracture, left hand: Secondary | ICD-10-CM | POA: Diagnosis not present

## 2017-10-17 DIAGNOSIS — M24542 Contracture, left hand: Secondary | ICD-10-CM | POA: Diagnosis not present

## 2017-10-17 DIAGNOSIS — E119 Type 2 diabetes mellitus without complications: Secondary | ICD-10-CM | POA: Diagnosis not present

## 2017-10-17 DIAGNOSIS — M79642 Pain in left hand: Secondary | ICD-10-CM | POA: Diagnosis not present

## 2017-10-18 DIAGNOSIS — M24542 Contracture, left hand: Secondary | ICD-10-CM | POA: Diagnosis not present

## 2017-10-18 DIAGNOSIS — M79642 Pain in left hand: Secondary | ICD-10-CM | POA: Diagnosis not present

## 2017-10-18 DIAGNOSIS — E119 Type 2 diabetes mellitus without complications: Secondary | ICD-10-CM | POA: Diagnosis not present

## 2017-10-21 DIAGNOSIS — M24542 Contracture, left hand: Secondary | ICD-10-CM | POA: Diagnosis not present

## 2017-10-21 DIAGNOSIS — M79642 Pain in left hand: Secondary | ICD-10-CM | POA: Diagnosis not present

## 2017-10-21 DIAGNOSIS — E119 Type 2 diabetes mellitus without complications: Secondary | ICD-10-CM | POA: Diagnosis not present

## 2017-11-01 DIAGNOSIS — M24542 Contracture, left hand: Secondary | ICD-10-CM | POA: Diagnosis not present

## 2017-11-02 DIAGNOSIS — M24542 Contracture, left hand: Secondary | ICD-10-CM | POA: Diagnosis not present

## 2017-11-08 DIAGNOSIS — R69 Illness, unspecified: Secondary | ICD-10-CM | POA: Diagnosis not present

## 2017-11-08 DIAGNOSIS — R5381 Other malaise: Secondary | ICD-10-CM | POA: Diagnosis not present

## 2017-11-08 DIAGNOSIS — G3184 Mild cognitive impairment, so stated: Secondary | ICD-10-CM | POA: Diagnosis not present

## 2017-11-25 DIAGNOSIS — I10 Essential (primary) hypertension: Secondary | ICD-10-CM | POA: Diagnosis not present

## 2017-11-25 DIAGNOSIS — R69 Illness, unspecified: Secondary | ICD-10-CM | POA: Diagnosis not present

## 2017-11-25 DIAGNOSIS — E038 Other specified hypothyroidism: Secondary | ICD-10-CM | POA: Diagnosis not present

## 2018-01-03 DIAGNOSIS — E1151 Type 2 diabetes mellitus with diabetic peripheral angiopathy without gangrene: Secondary | ICD-10-CM | POA: Diagnosis not present

## 2018-01-03 DIAGNOSIS — B351 Tinea unguium: Secondary | ICD-10-CM | POA: Diagnosis not present

## 2018-01-03 DIAGNOSIS — Z7984 Long term (current) use of oral hypoglycemic drugs: Secondary | ICD-10-CM | POA: Diagnosis not present

## 2018-01-21 DIAGNOSIS — G92 Toxic encephalopathy: Secondary | ICD-10-CM | POA: Diagnosis not present

## 2018-01-21 DIAGNOSIS — R419 Unspecified symptoms and signs involving cognitive functions and awareness: Secondary | ICD-10-CM | POA: Diagnosis not present

## 2018-01-21 DIAGNOSIS — G3184 Mild cognitive impairment, so stated: Secondary | ICD-10-CM | POA: Diagnosis not present

## 2018-01-21 DIAGNOSIS — R5381 Other malaise: Secondary | ICD-10-CM | POA: Diagnosis not present

## 2018-01-21 DIAGNOSIS — R69 Illness, unspecified: Secondary | ICD-10-CM | POA: Diagnosis not present

## 2018-01-29 DIAGNOSIS — K5909 Other constipation: Secondary | ICD-10-CM | POA: Diagnosis not present

## 2018-01-29 DIAGNOSIS — E038 Other specified hypothyroidism: Secondary | ICD-10-CM | POA: Diagnosis not present

## 2018-01-29 DIAGNOSIS — R69 Illness, unspecified: Secondary | ICD-10-CM | POA: Diagnosis not present

## 2018-01-29 DIAGNOSIS — I10 Essential (primary) hypertension: Secondary | ICD-10-CM | POA: Diagnosis not present

## 2018-02-27 DIAGNOSIS — M24542 Contracture, left hand: Secondary | ICD-10-CM | POA: Diagnosis not present

## 2018-02-27 DIAGNOSIS — M79642 Pain in left hand: Secondary | ICD-10-CM | POA: Diagnosis not present

## 2018-02-28 DIAGNOSIS — M24542 Contracture, left hand: Secondary | ICD-10-CM | POA: Diagnosis not present

## 2018-02-28 DIAGNOSIS — M79642 Pain in left hand: Secondary | ICD-10-CM | POA: Diagnosis not present

## 2018-03-03 DIAGNOSIS — M24542 Contracture, left hand: Secondary | ICD-10-CM | POA: Diagnosis not present

## 2018-03-03 DIAGNOSIS — M79642 Pain in left hand: Secondary | ICD-10-CM | POA: Diagnosis not present

## 2018-03-04 DIAGNOSIS — M24542 Contracture, left hand: Secondary | ICD-10-CM | POA: Diagnosis not present

## 2018-03-04 DIAGNOSIS — M79642 Pain in left hand: Secondary | ICD-10-CM | POA: Diagnosis not present

## 2018-03-05 DIAGNOSIS — M24542 Contracture, left hand: Secondary | ICD-10-CM | POA: Diagnosis not present

## 2018-03-05 DIAGNOSIS — M79642 Pain in left hand: Secondary | ICD-10-CM | POA: Diagnosis not present

## 2018-03-06 DIAGNOSIS — M24542 Contracture, left hand: Secondary | ICD-10-CM | POA: Diagnosis not present

## 2018-03-06 DIAGNOSIS — M79642 Pain in left hand: Secondary | ICD-10-CM | POA: Diagnosis not present

## 2018-03-07 DIAGNOSIS — M24542 Contracture, left hand: Secondary | ICD-10-CM | POA: Diagnosis not present

## 2018-03-07 DIAGNOSIS — M79642 Pain in left hand: Secondary | ICD-10-CM | POA: Diagnosis not present

## 2018-03-10 DIAGNOSIS — M79642 Pain in left hand: Secondary | ICD-10-CM | POA: Diagnosis not present

## 2018-03-10 DIAGNOSIS — M24542 Contracture, left hand: Secondary | ICD-10-CM | POA: Diagnosis not present

## 2018-03-11 DIAGNOSIS — M79642 Pain in left hand: Secondary | ICD-10-CM | POA: Diagnosis not present

## 2018-03-11 DIAGNOSIS — M24542 Contracture, left hand: Secondary | ICD-10-CM | POA: Diagnosis not present

## 2018-03-12 DIAGNOSIS — M24542 Contracture, left hand: Secondary | ICD-10-CM | POA: Diagnosis not present

## 2018-03-12 DIAGNOSIS — M79642 Pain in left hand: Secondary | ICD-10-CM | POA: Diagnosis not present

## 2018-03-13 DIAGNOSIS — B351 Tinea unguium: Secondary | ICD-10-CM | POA: Diagnosis not present

## 2018-03-13 DIAGNOSIS — M79642 Pain in left hand: Secondary | ICD-10-CM | POA: Diagnosis not present

## 2018-03-13 DIAGNOSIS — Z7984 Long term (current) use of oral hypoglycemic drugs: Secondary | ICD-10-CM | POA: Diagnosis not present

## 2018-03-13 DIAGNOSIS — R600 Localized edema: Secondary | ICD-10-CM | POA: Diagnosis not present

## 2018-03-13 DIAGNOSIS — E1151 Type 2 diabetes mellitus with diabetic peripheral angiopathy without gangrene: Secondary | ICD-10-CM | POA: Diagnosis not present

## 2018-03-13 DIAGNOSIS — M24542 Contracture, left hand: Secondary | ICD-10-CM | POA: Diagnosis not present

## 2018-03-14 DIAGNOSIS — M79642 Pain in left hand: Secondary | ICD-10-CM | POA: Diagnosis not present

## 2018-03-14 DIAGNOSIS — M24542 Contracture, left hand: Secondary | ICD-10-CM | POA: Diagnosis not present

## 2018-03-17 DIAGNOSIS — M79642 Pain in left hand: Secondary | ICD-10-CM | POA: Diagnosis not present

## 2018-03-17 DIAGNOSIS — M24542 Contracture, left hand: Secondary | ICD-10-CM | POA: Diagnosis not present

## 2018-03-18 DIAGNOSIS — M79642 Pain in left hand: Secondary | ICD-10-CM | POA: Diagnosis not present

## 2018-03-18 DIAGNOSIS — M24542 Contracture, left hand: Secondary | ICD-10-CM | POA: Diagnosis not present

## 2018-03-19 DIAGNOSIS — M24542 Contracture, left hand: Secondary | ICD-10-CM | POA: Diagnosis not present

## 2018-03-19 DIAGNOSIS — M79642 Pain in left hand: Secondary | ICD-10-CM | POA: Diagnosis not present

## 2018-03-20 DIAGNOSIS — M79642 Pain in left hand: Secondary | ICD-10-CM | POA: Diagnosis not present

## 2018-03-20 DIAGNOSIS — M24542 Contracture, left hand: Secondary | ICD-10-CM | POA: Diagnosis not present

## 2018-03-21 DIAGNOSIS — M24542 Contracture, left hand: Secondary | ICD-10-CM | POA: Diagnosis not present

## 2018-03-21 DIAGNOSIS — M79642 Pain in left hand: Secondary | ICD-10-CM | POA: Diagnosis not present

## 2018-03-24 DIAGNOSIS — M24542 Contracture, left hand: Secondary | ICD-10-CM | POA: Diagnosis not present

## 2018-03-24 DIAGNOSIS — M7642 Tibial collateral bursitis [Pellegrini-Stieda], left leg: Secondary | ICD-10-CM | POA: Diagnosis not present

## 2018-03-25 DIAGNOSIS — M24542 Contracture, left hand: Secondary | ICD-10-CM | POA: Diagnosis not present

## 2018-03-25 DIAGNOSIS — M7642 Tibial collateral bursitis [Pellegrini-Stieda], left leg: Secondary | ICD-10-CM | POA: Diagnosis not present

## 2018-03-26 DIAGNOSIS — R69 Illness, unspecified: Secondary | ICD-10-CM | POA: Diagnosis not present

## 2018-03-26 DIAGNOSIS — I10 Essential (primary) hypertension: Secondary | ICD-10-CM | POA: Diagnosis not present

## 2018-03-26 DIAGNOSIS — M8668 Other chronic osteomyelitis, other site: Secondary | ICD-10-CM | POA: Diagnosis not present

## 2018-03-26 DIAGNOSIS — M24542 Contracture, left hand: Secondary | ICD-10-CM | POA: Diagnosis not present

## 2018-03-26 DIAGNOSIS — E038 Other specified hypothyroidism: Secondary | ICD-10-CM | POA: Diagnosis not present

## 2018-03-26 DIAGNOSIS — M7642 Tibial collateral bursitis [Pellegrini-Stieda], left leg: Secondary | ICD-10-CM | POA: Diagnosis not present

## 2018-03-28 DIAGNOSIS — M7642 Tibial collateral bursitis [Pellegrini-Stieda], left leg: Secondary | ICD-10-CM | POA: Diagnosis not present

## 2018-03-28 DIAGNOSIS — M24542 Contracture, left hand: Secondary | ICD-10-CM | POA: Diagnosis not present

## 2018-03-31 DIAGNOSIS — M7642 Tibial collateral bursitis [Pellegrini-Stieda], left leg: Secondary | ICD-10-CM | POA: Diagnosis not present

## 2018-03-31 DIAGNOSIS — E785 Hyperlipidemia, unspecified: Secondary | ICD-10-CM | POA: Diagnosis not present

## 2018-03-31 DIAGNOSIS — I10 Essential (primary) hypertension: Secondary | ICD-10-CM | POA: Diagnosis not present

## 2018-03-31 DIAGNOSIS — M24542 Contracture, left hand: Secondary | ICD-10-CM | POA: Diagnosis not present

## 2018-04-02 DIAGNOSIS — M24542 Contracture, left hand: Secondary | ICD-10-CM | POA: Diagnosis not present

## 2018-04-02 DIAGNOSIS — M7642 Tibial collateral bursitis [Pellegrini-Stieda], left leg: Secondary | ICD-10-CM | POA: Diagnosis not present

## 2018-04-04 DIAGNOSIS — M24542 Contracture, left hand: Secondary | ICD-10-CM | POA: Diagnosis not present

## 2018-04-04 DIAGNOSIS — M7642 Tibial collateral bursitis [Pellegrini-Stieda], left leg: Secondary | ICD-10-CM | POA: Diagnosis not present

## 2018-04-07 DIAGNOSIS — M24542 Contracture, left hand: Secondary | ICD-10-CM | POA: Diagnosis not present

## 2018-04-07 DIAGNOSIS — M7642 Tibial collateral bursitis [Pellegrini-Stieda], left leg: Secondary | ICD-10-CM | POA: Diagnosis not present

## 2018-04-09 DIAGNOSIS — M7642 Tibial collateral bursitis [Pellegrini-Stieda], left leg: Secondary | ICD-10-CM | POA: Diagnosis not present

## 2018-04-09 DIAGNOSIS — M24542 Contracture, left hand: Secondary | ICD-10-CM | POA: Diagnosis not present

## 2018-04-14 DIAGNOSIS — M24542 Contracture, left hand: Secondary | ICD-10-CM | POA: Diagnosis not present

## 2018-04-14 DIAGNOSIS — M7642 Tibial collateral bursitis [Pellegrini-Stieda], left leg: Secondary | ICD-10-CM | POA: Diagnosis not present

## 2018-04-16 DIAGNOSIS — I10 Essential (primary) hypertension: Secondary | ICD-10-CM | POA: Diagnosis not present

## 2018-04-16 DIAGNOSIS — R69 Illness, unspecified: Secondary | ICD-10-CM | POA: Diagnosis not present

## 2018-04-16 DIAGNOSIS — E038 Other specified hypothyroidism: Secondary | ICD-10-CM | POA: Diagnosis not present

## 2018-04-16 DIAGNOSIS — R600 Localized edema: Secondary | ICD-10-CM | POA: Diagnosis not present

## 2018-04-17 DIAGNOSIS — M24542 Contracture, left hand: Secondary | ICD-10-CM | POA: Diagnosis not present

## 2018-04-17 DIAGNOSIS — M7642 Tibial collateral bursitis [Pellegrini-Stieda], left leg: Secondary | ICD-10-CM | POA: Diagnosis not present

## 2018-04-18 DIAGNOSIS — M25572 Pain in left ankle and joints of left foot: Secondary | ICD-10-CM | POA: Diagnosis not present

## 2018-04-18 DIAGNOSIS — M25472 Effusion, left ankle: Secondary | ICD-10-CM | POA: Diagnosis not present

## 2018-04-18 DIAGNOSIS — R600 Localized edema: Secondary | ICD-10-CM | POA: Diagnosis not present

## 2018-04-18 DIAGNOSIS — M79672 Pain in left foot: Secondary | ICD-10-CM | POA: Diagnosis not present

## 2018-04-21 DIAGNOSIS — M7642 Tibial collateral bursitis [Pellegrini-Stieda], left leg: Secondary | ICD-10-CM | POA: Diagnosis not present

## 2018-04-21 DIAGNOSIS — M24542 Contracture, left hand: Secondary | ICD-10-CM | POA: Diagnosis not present

## 2018-04-22 DIAGNOSIS — R419 Unspecified symptoms and signs involving cognitive functions and awareness: Secondary | ICD-10-CM | POA: Diagnosis not present

## 2018-04-22 DIAGNOSIS — G3184 Mild cognitive impairment, so stated: Secondary | ICD-10-CM | POA: Diagnosis not present

## 2018-04-22 DIAGNOSIS — R5381 Other malaise: Secondary | ICD-10-CM | POA: Diagnosis not present

## 2018-04-22 DIAGNOSIS — R69 Illness, unspecified: Secondary | ICD-10-CM | POA: Diagnosis not present

## 2018-04-24 DIAGNOSIS — M24542 Contracture, left hand: Secondary | ICD-10-CM | POA: Diagnosis not present

## 2018-04-24 DIAGNOSIS — M79642 Pain in left hand: Secondary | ICD-10-CM | POA: Diagnosis not present

## 2018-04-24 DIAGNOSIS — L03115 Cellulitis of right lower limb: Secondary | ICD-10-CM | POA: Diagnosis not present

## 2018-04-28 DIAGNOSIS — M79642 Pain in left hand: Secondary | ICD-10-CM | POA: Diagnosis not present

## 2018-04-28 DIAGNOSIS — L03115 Cellulitis of right lower limb: Secondary | ICD-10-CM | POA: Diagnosis not present

## 2018-04-28 DIAGNOSIS — M24542 Contracture, left hand: Secondary | ICD-10-CM | POA: Diagnosis not present

## 2018-05-01 DIAGNOSIS — L03115 Cellulitis of right lower limb: Secondary | ICD-10-CM | POA: Diagnosis not present

## 2018-05-01 DIAGNOSIS — M79642 Pain in left hand: Secondary | ICD-10-CM | POA: Diagnosis not present

## 2018-05-01 DIAGNOSIS — M24542 Contracture, left hand: Secondary | ICD-10-CM | POA: Diagnosis not present

## 2018-05-05 DIAGNOSIS — L03115 Cellulitis of right lower limb: Secondary | ICD-10-CM | POA: Diagnosis not present

## 2018-05-05 DIAGNOSIS — M24542 Contracture, left hand: Secondary | ICD-10-CM | POA: Diagnosis not present

## 2018-05-05 DIAGNOSIS — M79642 Pain in left hand: Secondary | ICD-10-CM | POA: Diagnosis not present

## 2018-05-07 DIAGNOSIS — M25472 Effusion, left ankle: Secondary | ICD-10-CM | POA: Diagnosis not present

## 2018-05-07 DIAGNOSIS — R600 Localized edema: Secondary | ICD-10-CM | POA: Diagnosis not present

## 2018-05-08 DIAGNOSIS — M79642 Pain in left hand: Secondary | ICD-10-CM | POA: Diagnosis not present

## 2018-05-08 DIAGNOSIS — L03115 Cellulitis of right lower limb: Secondary | ICD-10-CM | POA: Diagnosis not present

## 2018-05-08 DIAGNOSIS — M24542 Contracture, left hand: Secondary | ICD-10-CM | POA: Diagnosis not present

## 2018-05-12 DIAGNOSIS — M79642 Pain in left hand: Secondary | ICD-10-CM | POA: Diagnosis not present

## 2018-05-12 DIAGNOSIS — L03115 Cellulitis of right lower limb: Secondary | ICD-10-CM | POA: Diagnosis not present

## 2018-05-12 DIAGNOSIS — M24542 Contracture, left hand: Secondary | ICD-10-CM | POA: Diagnosis not present

## 2018-05-12 DIAGNOSIS — I10 Essential (primary) hypertension: Secondary | ICD-10-CM | POA: Diagnosis not present

## 2018-05-15 DIAGNOSIS — L03115 Cellulitis of right lower limb: Secondary | ICD-10-CM | POA: Diagnosis not present

## 2018-05-15 DIAGNOSIS — M79642 Pain in left hand: Secondary | ICD-10-CM | POA: Diagnosis not present

## 2018-05-15 DIAGNOSIS — M24542 Contracture, left hand: Secondary | ICD-10-CM | POA: Diagnosis not present

## 2018-05-20 DIAGNOSIS — E1151 Type 2 diabetes mellitus with diabetic peripheral angiopathy without gangrene: Secondary | ICD-10-CM | POA: Diagnosis not present

## 2018-05-20 DIAGNOSIS — Z7984 Long term (current) use of oral hypoglycemic drugs: Secondary | ICD-10-CM | POA: Diagnosis not present

## 2018-05-20 DIAGNOSIS — B351 Tinea unguium: Secondary | ICD-10-CM | POA: Diagnosis not present

## 2018-05-21 DIAGNOSIS — M24542 Contracture, left hand: Secondary | ICD-10-CM | POA: Diagnosis not present

## 2018-05-21 DIAGNOSIS — M79642 Pain in left hand: Secondary | ICD-10-CM | POA: Diagnosis not present

## 2018-05-21 DIAGNOSIS — L03115 Cellulitis of right lower limb: Secondary | ICD-10-CM | POA: Diagnosis not present

## 2018-05-23 DIAGNOSIS — M24542 Contracture, left hand: Secondary | ICD-10-CM | POA: Diagnosis not present

## 2018-05-23 DIAGNOSIS — L03115 Cellulitis of right lower limb: Secondary | ICD-10-CM | POA: Diagnosis not present

## 2018-05-23 DIAGNOSIS — M79642 Pain in left hand: Secondary | ICD-10-CM | POA: Diagnosis not present

## 2018-05-26 DIAGNOSIS — M24542 Contracture, left hand: Secondary | ICD-10-CM | POA: Diagnosis not present

## 2018-05-26 DIAGNOSIS — E119 Type 2 diabetes mellitus without complications: Secondary | ICD-10-CM | POA: Diagnosis not present

## 2018-05-26 DIAGNOSIS — M79642 Pain in left hand: Secondary | ICD-10-CM | POA: Diagnosis not present

## 2018-05-29 DIAGNOSIS — M79642 Pain in left hand: Secondary | ICD-10-CM | POA: Diagnosis not present

## 2018-05-29 DIAGNOSIS — M24542 Contracture, left hand: Secondary | ICD-10-CM | POA: Diagnosis not present

## 2018-05-29 DIAGNOSIS — E119 Type 2 diabetes mellitus without complications: Secondary | ICD-10-CM | POA: Diagnosis not present

## 2018-06-02 DIAGNOSIS — M24542 Contracture, left hand: Secondary | ICD-10-CM | POA: Diagnosis not present

## 2018-06-02 DIAGNOSIS — E119 Type 2 diabetes mellitus without complications: Secondary | ICD-10-CM | POA: Diagnosis not present

## 2018-06-02 DIAGNOSIS — M79642 Pain in left hand: Secondary | ICD-10-CM | POA: Diagnosis not present

## 2018-06-04 DIAGNOSIS — I10 Essential (primary) hypertension: Secondary | ICD-10-CM | POA: Diagnosis not present

## 2018-06-04 DIAGNOSIS — E038 Other specified hypothyroidism: Secondary | ICD-10-CM | POA: Diagnosis not present

## 2018-06-04 DIAGNOSIS — R69 Illness, unspecified: Secondary | ICD-10-CM | POA: Diagnosis not present

## 2018-06-04 DIAGNOSIS — M24542 Contracture, left hand: Secondary | ICD-10-CM | POA: Diagnosis not present

## 2018-06-05 DIAGNOSIS — E119 Type 2 diabetes mellitus without complications: Secondary | ICD-10-CM | POA: Diagnosis not present

## 2018-06-05 DIAGNOSIS — M24542 Contracture, left hand: Secondary | ICD-10-CM | POA: Diagnosis not present

## 2018-06-05 DIAGNOSIS — I10 Essential (primary) hypertension: Secondary | ICD-10-CM | POA: Diagnosis not present

## 2018-06-05 DIAGNOSIS — E559 Vitamin D deficiency, unspecified: Secondary | ICD-10-CM | POA: Diagnosis not present

## 2018-06-05 DIAGNOSIS — E785 Hyperlipidemia, unspecified: Secondary | ICD-10-CM | POA: Diagnosis not present

## 2018-06-05 DIAGNOSIS — M79642 Pain in left hand: Secondary | ICD-10-CM | POA: Diagnosis not present

## 2018-06-05 DIAGNOSIS — E039 Hypothyroidism, unspecified: Secondary | ICD-10-CM | POA: Diagnosis not present

## 2018-06-06 DIAGNOSIS — I10 Essential (primary) hypertension: Secondary | ICD-10-CM | POA: Diagnosis not present

## 2018-06-06 DIAGNOSIS — E038 Other specified hypothyroidism: Secondary | ICD-10-CM | POA: Diagnosis not present

## 2018-06-09 DIAGNOSIS — E119 Type 2 diabetes mellitus without complications: Secondary | ICD-10-CM | POA: Diagnosis not present

## 2018-06-09 DIAGNOSIS — M79642 Pain in left hand: Secondary | ICD-10-CM | POA: Diagnosis not present

## 2018-06-09 DIAGNOSIS — M24542 Contracture, left hand: Secondary | ICD-10-CM | POA: Diagnosis not present

## 2018-06-12 DIAGNOSIS — M24542 Contracture, left hand: Secondary | ICD-10-CM | POA: Diagnosis not present

## 2018-06-12 DIAGNOSIS — E119 Type 2 diabetes mellitus without complications: Secondary | ICD-10-CM | POA: Diagnosis not present

## 2018-06-12 DIAGNOSIS — M79642 Pain in left hand: Secondary | ICD-10-CM | POA: Diagnosis not present

## 2018-06-16 DIAGNOSIS — M24542 Contracture, left hand: Secondary | ICD-10-CM | POA: Diagnosis not present

## 2018-06-16 DIAGNOSIS — E119 Type 2 diabetes mellitus without complications: Secondary | ICD-10-CM | POA: Diagnosis not present

## 2018-06-16 DIAGNOSIS — M79642 Pain in left hand: Secondary | ICD-10-CM | POA: Diagnosis not present

## 2018-06-19 DIAGNOSIS — M24542 Contracture, left hand: Secondary | ICD-10-CM | POA: Diagnosis not present

## 2018-06-19 DIAGNOSIS — M79642 Pain in left hand: Secondary | ICD-10-CM | POA: Diagnosis not present

## 2018-06-19 DIAGNOSIS — E119 Type 2 diabetes mellitus without complications: Secondary | ICD-10-CM | POA: Diagnosis not present

## 2018-06-23 DIAGNOSIS — E119 Type 2 diabetes mellitus without complications: Secondary | ICD-10-CM | POA: Diagnosis not present

## 2018-06-23 DIAGNOSIS — M79642 Pain in left hand: Secondary | ICD-10-CM | POA: Diagnosis not present

## 2018-06-23 DIAGNOSIS — M24542 Contracture, left hand: Secondary | ICD-10-CM | POA: Diagnosis not present

## 2018-06-30 DIAGNOSIS — I10 Essential (primary) hypertension: Secondary | ICD-10-CM | POA: Diagnosis not present

## 2018-06-30 DIAGNOSIS — K08409 Partial loss of teeth, unspecified cause, unspecified class: Secondary | ICD-10-CM | POA: Diagnosis not present

## 2018-06-30 DIAGNOSIS — R600 Localized edema: Secondary | ICD-10-CM | POA: Diagnosis not present

## 2018-06-30 DIAGNOSIS — E568 Deficiency of other vitamins: Secondary | ICD-10-CM | POA: Diagnosis not present

## 2018-07-01 DIAGNOSIS — R5381 Other malaise: Secondary | ICD-10-CM | POA: Diagnosis not present

## 2018-07-01 DIAGNOSIS — R69 Illness, unspecified: Secondary | ICD-10-CM | POA: Diagnosis not present

## 2018-07-01 DIAGNOSIS — F329 Major depressive disorder, single episode, unspecified: Secondary | ICD-10-CM | POA: Diagnosis not present

## 2018-07-15 DIAGNOSIS — R32 Unspecified urinary incontinence: Secondary | ICD-10-CM | POA: Diagnosis not present

## 2018-07-18 DIAGNOSIS — N390 Urinary tract infection, site not specified: Secondary | ICD-10-CM | POA: Diagnosis not present

## 2018-07-18 DIAGNOSIS — Z0000 Encounter for general adult medical examination without abnormal findings: Secondary | ICD-10-CM | POA: Diagnosis not present

## 2018-07-21 DIAGNOSIS — I10 Essential (primary) hypertension: Secondary | ICD-10-CM | POA: Diagnosis not present

## 2018-07-21 DIAGNOSIS — E119 Type 2 diabetes mellitus without complications: Secondary | ICD-10-CM | POA: Diagnosis not present

## 2018-07-21 DIAGNOSIS — E559 Vitamin D deficiency, unspecified: Secondary | ICD-10-CM | POA: Diagnosis not present

## 2018-07-21 DIAGNOSIS — E039 Hypothyroidism, unspecified: Secondary | ICD-10-CM | POA: Diagnosis not present

## 2018-07-25 DIAGNOSIS — R600 Localized edema: Secondary | ICD-10-CM | POA: Diagnosis not present

## 2018-07-25 DIAGNOSIS — I951 Orthostatic hypotension: Secondary | ICD-10-CM | POA: Diagnosis not present

## 2018-07-28 DIAGNOSIS — M79642 Pain in left hand: Secondary | ICD-10-CM | POA: Diagnosis not present

## 2018-07-28 DIAGNOSIS — M24542 Contracture, left hand: Secondary | ICD-10-CM | POA: Diagnosis not present

## 2018-07-28 DIAGNOSIS — M6281 Muscle weakness (generalized): Secondary | ICD-10-CM | POA: Diagnosis not present

## 2018-07-31 DIAGNOSIS — M6281 Muscle weakness (generalized): Secondary | ICD-10-CM | POA: Diagnosis not present

## 2018-07-31 DIAGNOSIS — R600 Localized edema: Secondary | ICD-10-CM | POA: Diagnosis not present

## 2018-07-31 DIAGNOSIS — R69 Illness, unspecified: Secondary | ICD-10-CM | POA: Diagnosis not present

## 2018-07-31 DIAGNOSIS — I10 Essential (primary) hypertension: Secondary | ICD-10-CM | POA: Diagnosis not present

## 2018-07-31 DIAGNOSIS — M8668 Other chronic osteomyelitis, other site: Secondary | ICD-10-CM | POA: Diagnosis not present

## 2018-07-31 DIAGNOSIS — M24542 Contracture, left hand: Secondary | ICD-10-CM | POA: Diagnosis not present

## 2018-07-31 DIAGNOSIS — M79642 Pain in left hand: Secondary | ICD-10-CM | POA: Diagnosis not present

## 2018-08-04 DIAGNOSIS — M79642 Pain in left hand: Secondary | ICD-10-CM | POA: Diagnosis not present

## 2018-08-04 DIAGNOSIS — M6281 Muscle weakness (generalized): Secondary | ICD-10-CM | POA: Diagnosis not present

## 2018-08-04 DIAGNOSIS — M24542 Contracture, left hand: Secondary | ICD-10-CM | POA: Diagnosis not present

## 2018-08-07 DIAGNOSIS — M6281 Muscle weakness (generalized): Secondary | ICD-10-CM | POA: Diagnosis not present

## 2018-08-07 DIAGNOSIS — M79642 Pain in left hand: Secondary | ICD-10-CM | POA: Diagnosis not present

## 2018-08-07 DIAGNOSIS — M24542 Contracture, left hand: Secondary | ICD-10-CM | POA: Diagnosis not present

## 2018-08-11 DIAGNOSIS — M6281 Muscle weakness (generalized): Secondary | ICD-10-CM | POA: Diagnosis not present

## 2018-08-11 DIAGNOSIS — B3789 Other sites of candidiasis: Secondary | ICD-10-CM | POA: Diagnosis not present

## 2018-08-11 DIAGNOSIS — R198 Other specified symptoms and signs involving the digestive system and abdomen: Secondary | ICD-10-CM | POA: Diagnosis not present

## 2018-08-11 DIAGNOSIS — M24542 Contracture, left hand: Secondary | ICD-10-CM | POA: Diagnosis not present

## 2018-08-11 DIAGNOSIS — M79642 Pain in left hand: Secondary | ICD-10-CM | POA: Diagnosis not present

## 2018-08-14 DIAGNOSIS — M79642 Pain in left hand: Secondary | ICD-10-CM | POA: Diagnosis not present

## 2018-08-14 DIAGNOSIS — M6281 Muscle weakness (generalized): Secondary | ICD-10-CM | POA: Diagnosis not present

## 2018-08-14 DIAGNOSIS — M24542 Contracture, left hand: Secondary | ICD-10-CM | POA: Diagnosis not present

## 2018-08-18 DIAGNOSIS — M6281 Muscle weakness (generalized): Secondary | ICD-10-CM | POA: Diagnosis not present

## 2018-08-18 DIAGNOSIS — M79642 Pain in left hand: Secondary | ICD-10-CM | POA: Diagnosis not present

## 2018-08-18 DIAGNOSIS — M24542 Contracture, left hand: Secondary | ICD-10-CM | POA: Diagnosis not present

## 2018-08-22 DIAGNOSIS — M24542 Contracture, left hand: Secondary | ICD-10-CM | POA: Diagnosis not present

## 2018-08-22 DIAGNOSIS — M79642 Pain in left hand: Secondary | ICD-10-CM | POA: Diagnosis not present

## 2018-08-22 DIAGNOSIS — M6281 Muscle weakness (generalized): Secondary | ICD-10-CM | POA: Diagnosis not present

## 2018-08-26 DIAGNOSIS — M24542 Contracture, left hand: Secondary | ICD-10-CM | POA: Diagnosis not present

## 2018-08-26 DIAGNOSIS — M79642 Pain in left hand: Secondary | ICD-10-CM | POA: Diagnosis not present

## 2018-08-26 DIAGNOSIS — M6281 Muscle weakness (generalized): Secondary | ICD-10-CM | POA: Diagnosis not present

## 2018-08-29 DIAGNOSIS — M6281 Muscle weakness (generalized): Secondary | ICD-10-CM | POA: Diagnosis not present

## 2018-08-29 DIAGNOSIS — M24542 Contracture, left hand: Secondary | ICD-10-CM | POA: Diagnosis not present

## 2018-08-29 DIAGNOSIS — M79642 Pain in left hand: Secondary | ICD-10-CM | POA: Diagnosis not present

## 2018-09-01 DIAGNOSIS — M79642 Pain in left hand: Secondary | ICD-10-CM | POA: Diagnosis not present

## 2018-09-01 DIAGNOSIS — M6281 Muscle weakness (generalized): Secondary | ICD-10-CM | POA: Diagnosis not present

## 2018-09-01 DIAGNOSIS — M24542 Contracture, left hand: Secondary | ICD-10-CM | POA: Diagnosis not present

## 2018-09-01 DIAGNOSIS — E039 Hypothyroidism, unspecified: Secondary | ICD-10-CM | POA: Diagnosis not present

## 2018-09-04 DIAGNOSIS — M6281 Muscle weakness (generalized): Secondary | ICD-10-CM | POA: Diagnosis not present

## 2018-09-04 DIAGNOSIS — M24542 Contracture, left hand: Secondary | ICD-10-CM | POA: Diagnosis not present

## 2018-09-04 DIAGNOSIS — M79642 Pain in left hand: Secondary | ICD-10-CM | POA: Diagnosis not present

## 2018-09-08 DIAGNOSIS — M6281 Muscle weakness (generalized): Secondary | ICD-10-CM | POA: Diagnosis not present

## 2018-09-08 DIAGNOSIS — M24542 Contracture, left hand: Secondary | ICD-10-CM | POA: Diagnosis not present

## 2018-09-08 DIAGNOSIS — M79642 Pain in left hand: Secondary | ICD-10-CM | POA: Diagnosis not present

## 2018-09-11 DIAGNOSIS — M79642 Pain in left hand: Secondary | ICD-10-CM | POA: Diagnosis not present

## 2018-09-11 DIAGNOSIS — M6281 Muscle weakness (generalized): Secondary | ICD-10-CM | POA: Diagnosis not present

## 2018-09-11 DIAGNOSIS — M24542 Contracture, left hand: Secondary | ICD-10-CM | POA: Diagnosis not present

## 2018-09-12 DIAGNOSIS — I129 Hypertensive chronic kidney disease with stage 1 through stage 4 chronic kidney disease, or unspecified chronic kidney disease: Secondary | ICD-10-CM | POA: Diagnosis not present

## 2018-09-12 DIAGNOSIS — R609 Edema, unspecified: Secondary | ICD-10-CM | POA: Diagnosis not present

## 2018-09-12 DIAGNOSIS — N183 Chronic kidney disease, stage 3 (moderate): Secondary | ICD-10-CM | POA: Diagnosis not present

## 2018-09-12 DIAGNOSIS — M8660 Other chronic osteomyelitis, unspecified site: Secondary | ICD-10-CM | POA: Diagnosis not present

## 2018-09-12 DIAGNOSIS — E1129 Type 2 diabetes mellitus with other diabetic kidney complication: Secondary | ICD-10-CM | POA: Diagnosis not present

## 2018-09-16 DIAGNOSIS — M24542 Contracture, left hand: Secondary | ICD-10-CM | POA: Diagnosis not present

## 2018-09-16 DIAGNOSIS — M79642 Pain in left hand: Secondary | ICD-10-CM | POA: Diagnosis not present

## 2018-09-16 DIAGNOSIS — M6281 Muscle weakness (generalized): Secondary | ICD-10-CM | POA: Diagnosis not present

## 2018-09-20 DIAGNOSIS — M6281 Muscle weakness (generalized): Secondary | ICD-10-CM | POA: Diagnosis not present

## 2018-09-20 DIAGNOSIS — M24542 Contracture, left hand: Secondary | ICD-10-CM | POA: Diagnosis not present

## 2018-09-20 DIAGNOSIS — M79642 Pain in left hand: Secondary | ICD-10-CM | POA: Diagnosis not present

## 2018-09-22 DIAGNOSIS — M24542 Contracture, left hand: Secondary | ICD-10-CM | POA: Diagnosis not present

## 2018-09-22 DIAGNOSIS — M6281 Muscle weakness (generalized): Secondary | ICD-10-CM | POA: Diagnosis not present

## 2018-09-22 DIAGNOSIS — M79642 Pain in left hand: Secondary | ICD-10-CM | POA: Diagnosis not present

## 2019-01-18 IMAGING — CR DG CHEST 1V PORT
1 series · 1 of 1 positions shown · non-contrast
Comparison: Chest radiograph 09/13/2014

CLINICAL DATA: Right leg weakness

EXAM:
PORTABLE CHEST 1 VIEW

[portable]
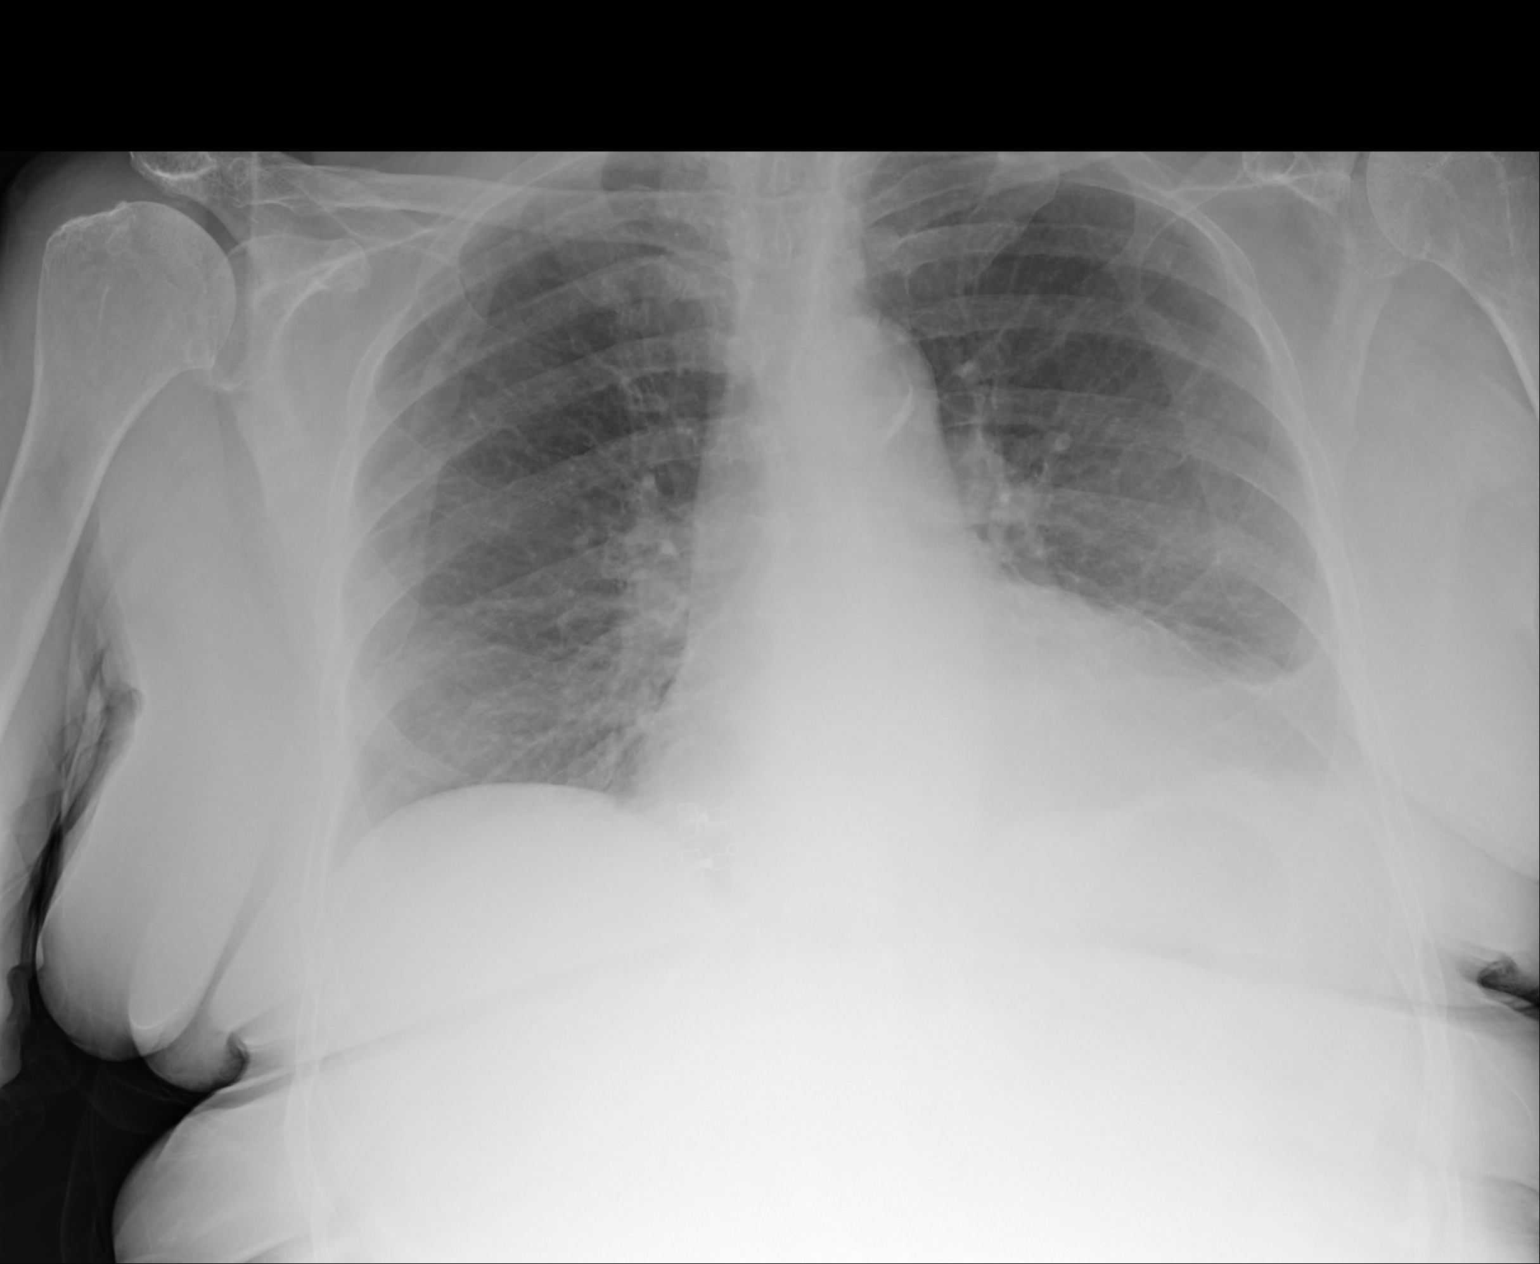

[1 of 1 positions shown; findings below may reference images not displayed]

FINDINGS: Mild cardiomegaly with atherosclerotic calcification in the aortic
arch. Bibasilar atelectasis. No focal airspace consolidation or
pulmonary edema. No pneumothorax or sizable pleural effusion.
Shallow lung inflation.
IMPRESSION: Hypoinflation and bibasilar atelectasis.  Aortic atherosclerosis.

## 2019-01-18 IMAGING — CT CT HEAD W/O CM
3 series · 16 of 47 positions shown, 19 images · non-contrast
Comparison: Head CT 06/28/2010

CLINICAL DATA: Right leg weakness. Patient reports vertigo for 1
week.

EXAM:
CT HEAD WITHOUT CONTRAST
TECHNIQUE: Contiguous axial images were obtained from the base of the skull
through the vertex without intravenous contrast.

[Series 2: head wo · axial · 0.44mm/px · z∈[-53,+92]mm · 10 of 35 slices shown, 13 images]
[im 3/35  brain]
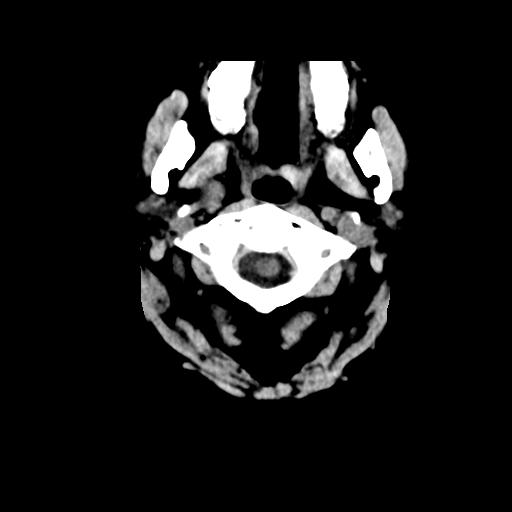
[im 3/35  bone]
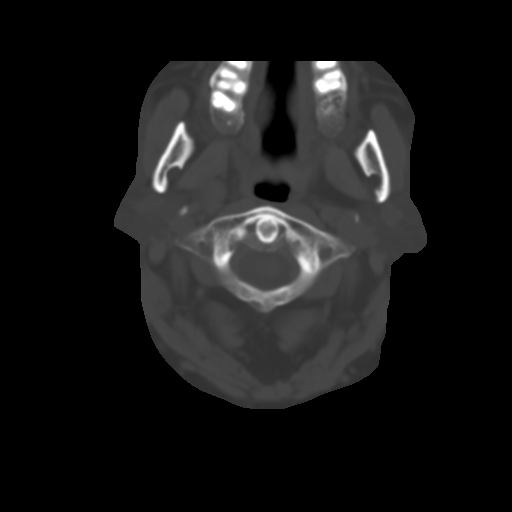
[im 6/35  brain]
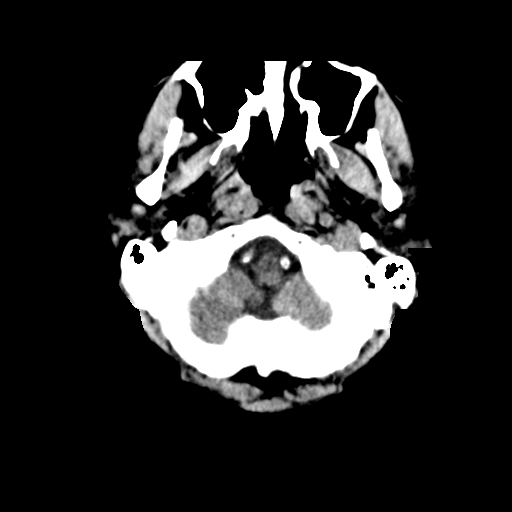
[im 10/35  brain]
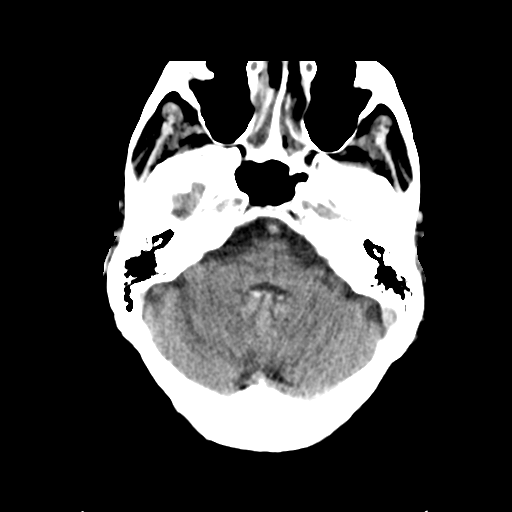
[im 12/35  brain]
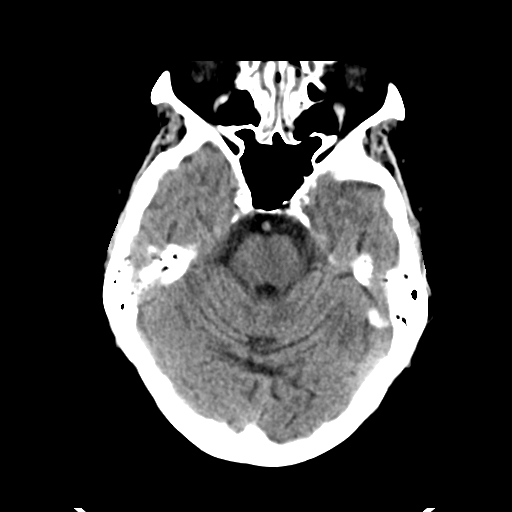
[im 16/35  brain]
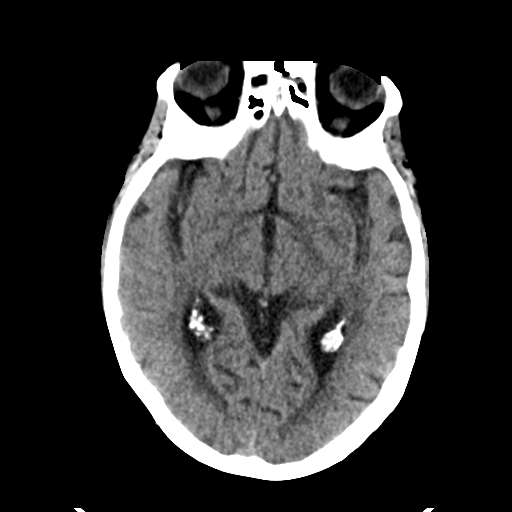
[im 16/35  bone]
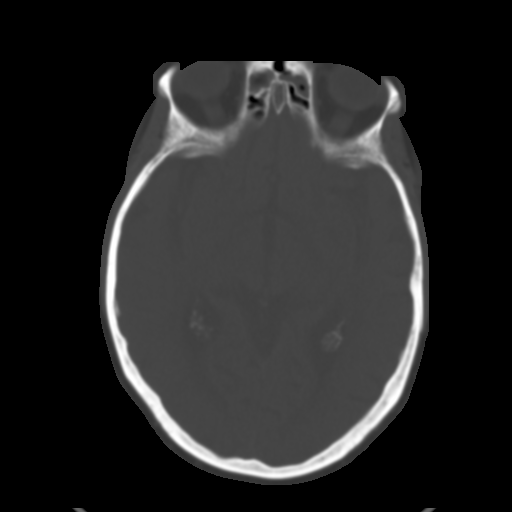
[im 19/35  brain]
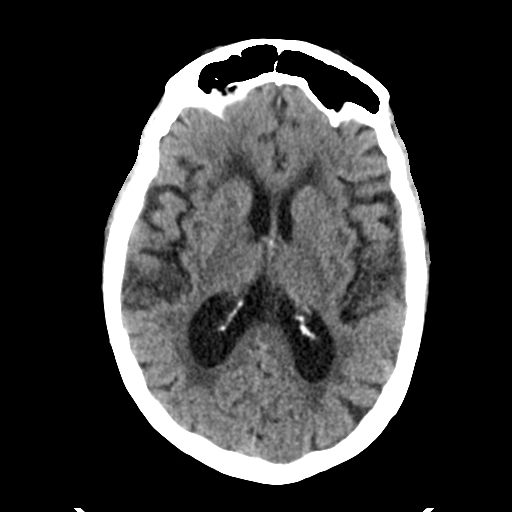
[im 23/35  brain]
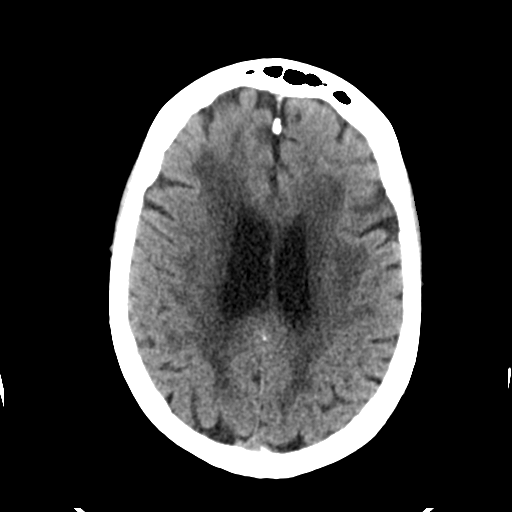
[im 26/35  brain]
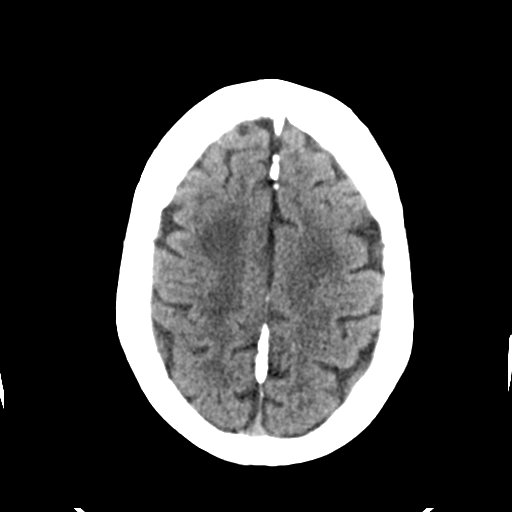
[im 29/35  brain]
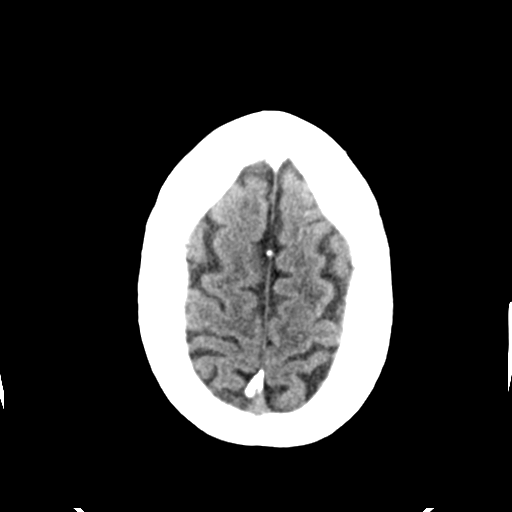
[im 29/35  bone]
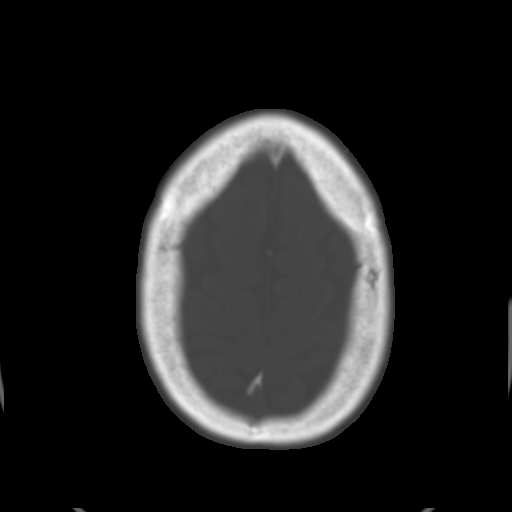
[im 32/35  brain]
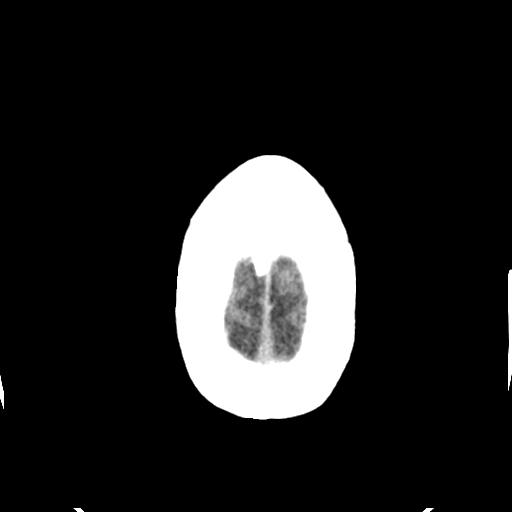

[Series 4: coronal soft tissue · coronal · 0.42mm/px · 3 of 81 slices shown]
[im 27/81  brain]
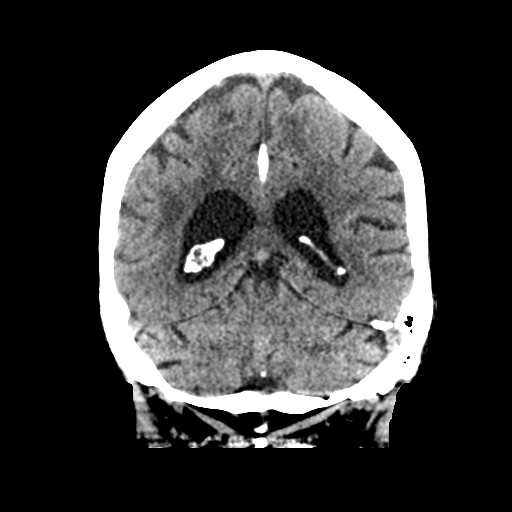
[im 36/81  brain]
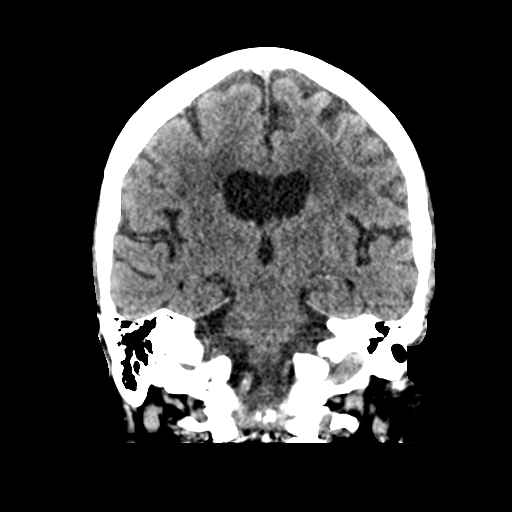
[im 45/81  brain]
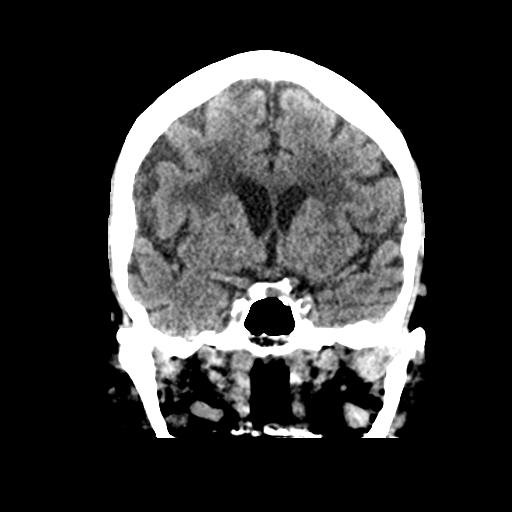

[Series 5: sagittal soft tissue · sagittal · 0.39mm/px · 3 of 67 slices shown]
[im 23/67  brain]
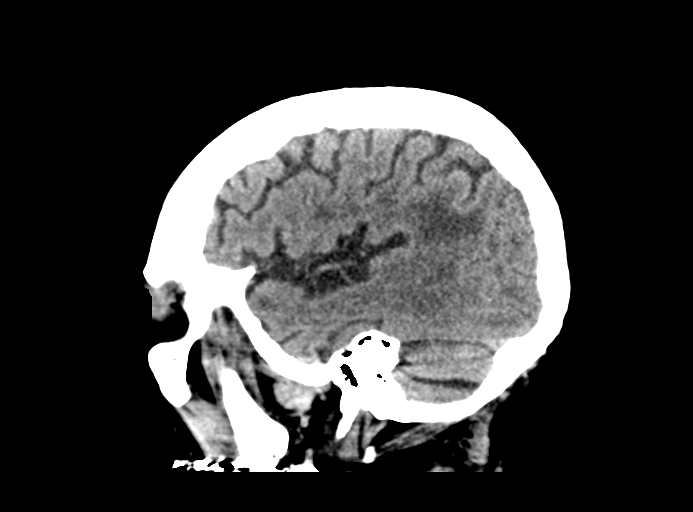
[im 34/67  brain]
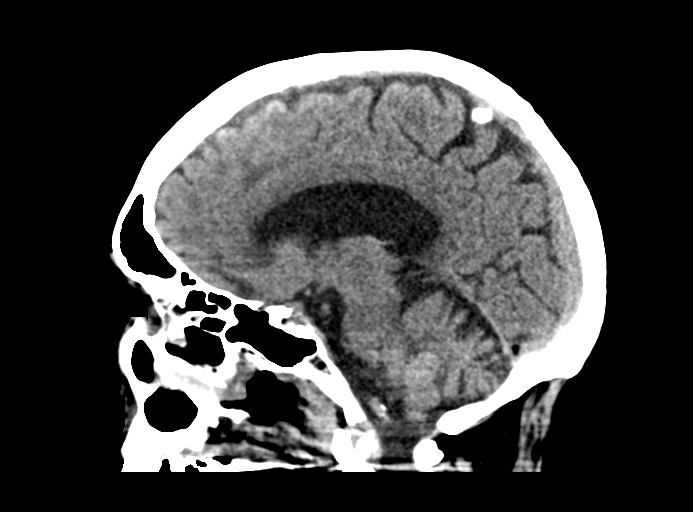
[im 45/67  brain]
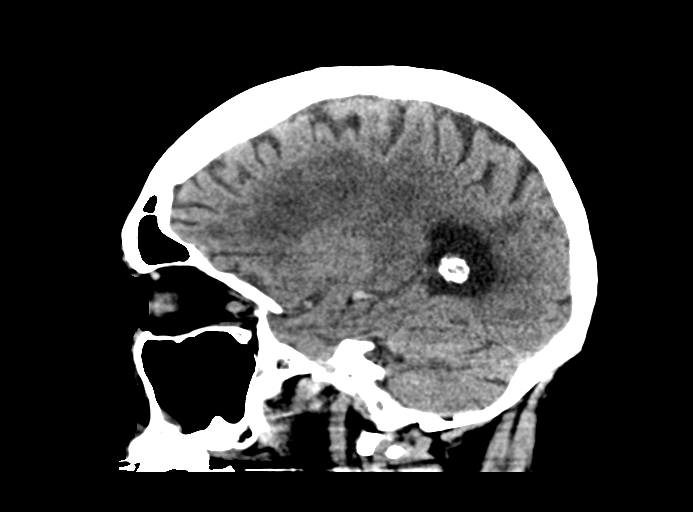

[16 of 47 positions shown; findings below may reference images not displayed]

FINDINGS: Brain: No evidence of acute infarction, hemorrhage, hydrocephalus,
extra-axial collection or mass lesion/mass effect. Progressive
atrophy and chronic small vessel ischemia from most recent
comparison exam 7 years prior.

Vascular: Atherosclerosis of skullbase vasculature without
hyperdense vessel or abnormal calcification.

Skull: Normal. Negative for fracture or focal lesion.

Sinuses/Orbits: Mucosal thickening of the ethmoid air cells.
Remaining sinuses are clear. Mastoid air cells are well-aerated.

Other: None.
IMPRESSION: No acute intracranial abnormality. Generalized atrophy and chronic
small vessel ischemia.

## 2019-01-18 IMAGING — DX DG KNEE COMPLETE 4+V*R*
4 series · 4 of 4 positions shown · non-contrast
Comparison: 10/26/2014

CLINICAL DATA: Vertigo. Generalized weakness, greatest in the right
lower extremity.

EXAM:
RIGHT KNEE - COMPLETE 4+ VIEW

[knee ap (1 of 3)]
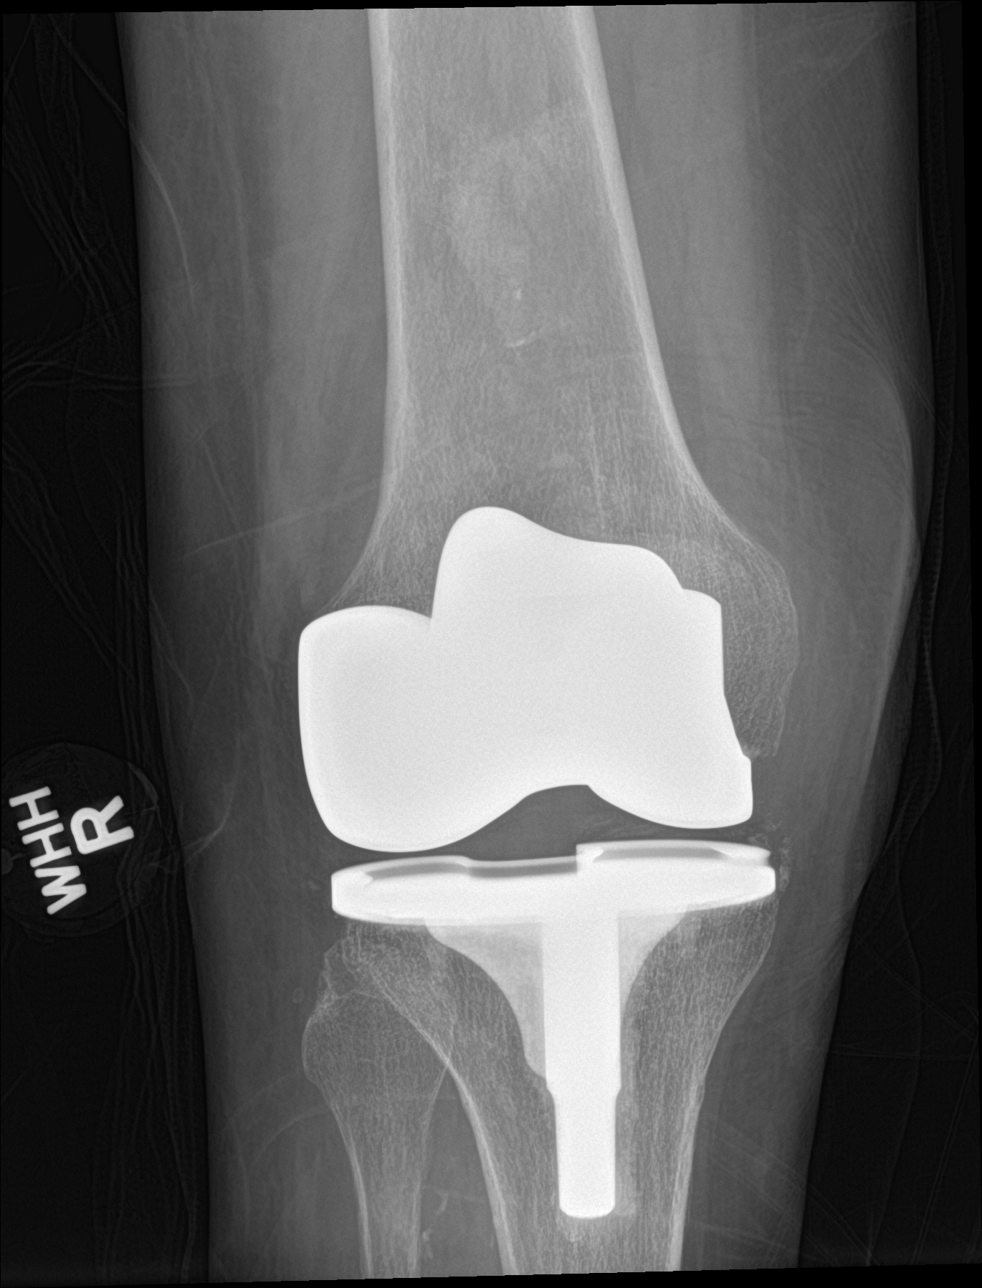

[knee lat]
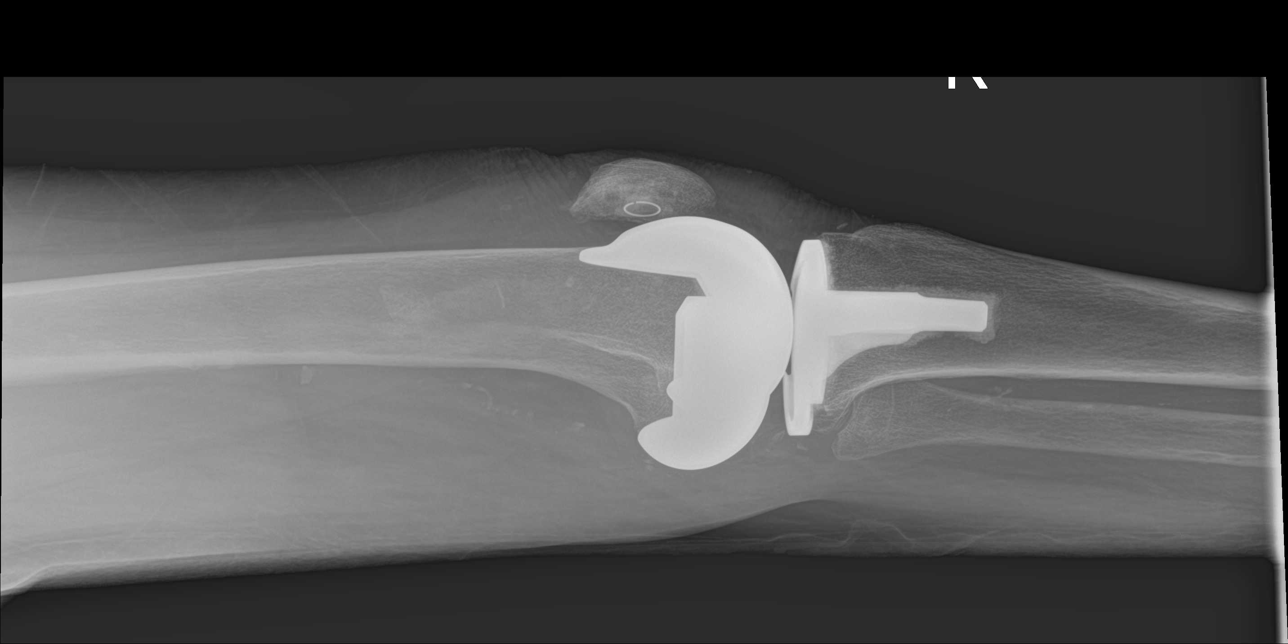

[knee ap (2 of 3)]
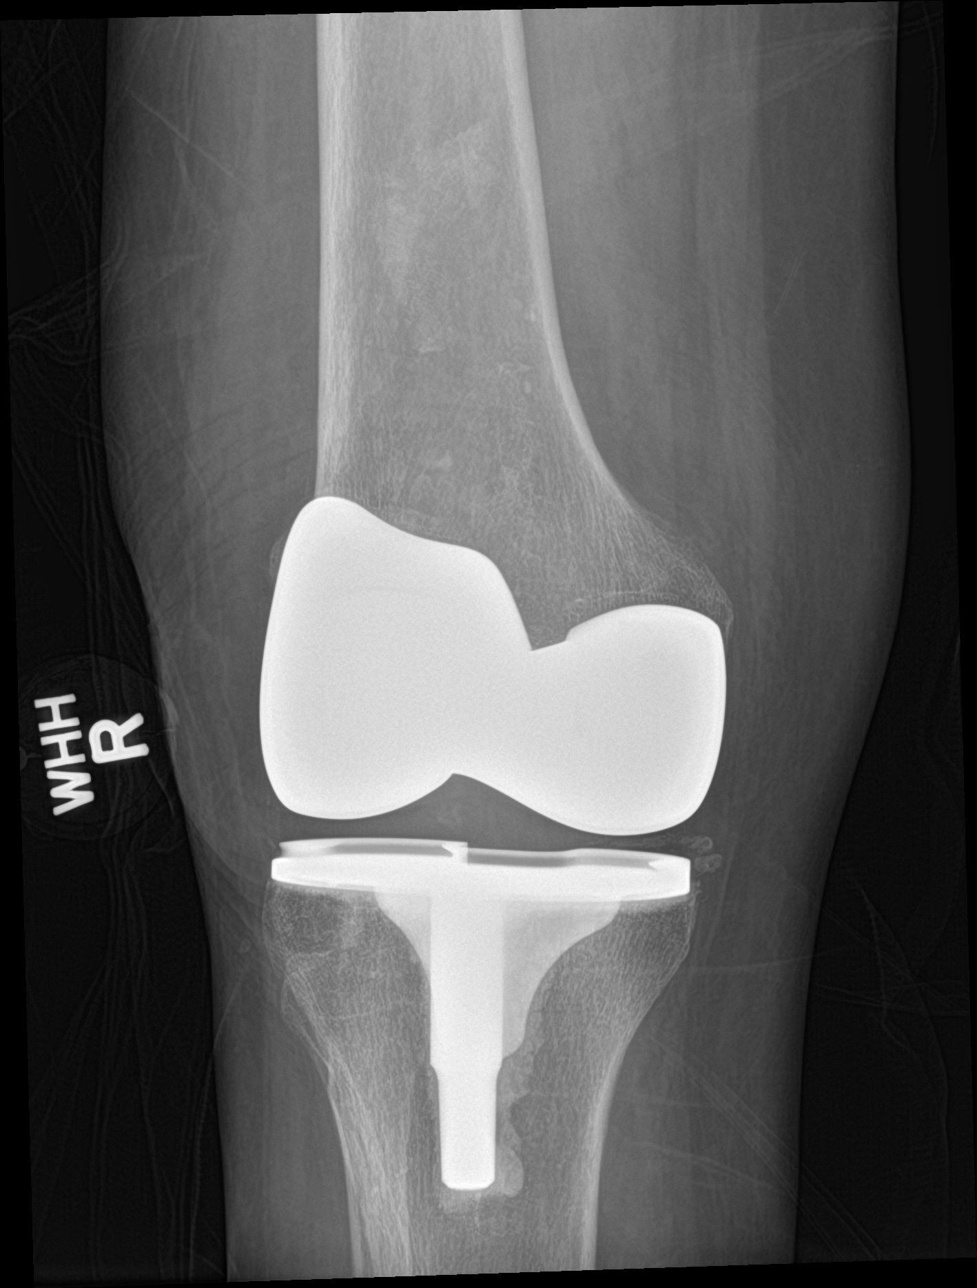

[knee ap (3 of 3)]
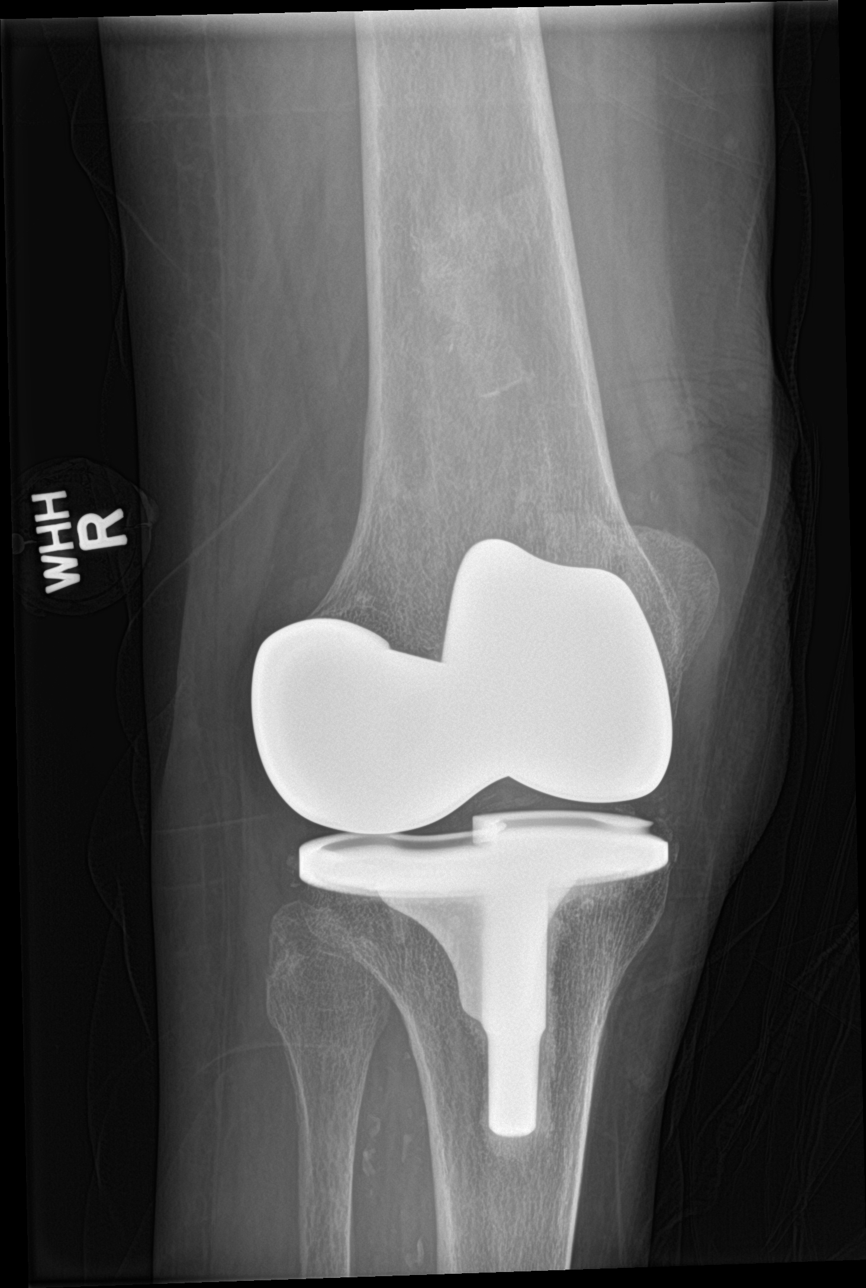

[4 of 4 positions shown; findings below may reference images not displayed]

FINDINGS: The total knee arthroplasty hardware appears intact. No evidence of
acute fracture. No dislocation. No acute soft tissue abnormality.
IMPRESSION: Negative.

## 2019-03-25 DEATH — deceased

## 2019-05-04 ENCOUNTER — Telehealth: Payer: Self-pay | Admitting: Internal Medicine

## 2019-05-04 NOTE — Telephone Encounter (Signed)
Recall - # no longer available
